# Patient Record
Sex: Female | Born: 1985 | Race: Black or African American | Hispanic: No | Marital: Single | Smoking: Former smoker
Health system: Southern US, Community
[De-identification: ages and names within clinical notes are randomized; demographics above are authoritative.]

## PROBLEM LIST (undated history)

## (undated) ENCOUNTER — Inpatient Hospital Stay (HOSPITAL_COMMUNITY): Payer: Self-pay

## (undated) DIAGNOSIS — R87629 Unspecified abnormal cytological findings in specimens from vagina: Secondary | ICD-10-CM

## (undated) DIAGNOSIS — F419 Anxiety disorder, unspecified: Secondary | ICD-10-CM

## (undated) DIAGNOSIS — O280 Abnormal hematological finding on antenatal screening of mother: Secondary | ICD-10-CM

## (undated) DIAGNOSIS — K219 Gastro-esophageal reflux disease without esophagitis: Secondary | ICD-10-CM

## (undated) HISTORY — PX: NO PAST SURGERIES: SHX2092

---
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
## 2008-01-13 ENCOUNTER — Emergency Department (HOSPITAL_COMMUNITY): Admission: EM | Admit: 2008-01-13 | Discharge: 2008-01-13 | Payer: Self-pay | Admitting: Emergency Medicine

## 2008-11-15 ENCOUNTER — Emergency Department (HOSPITAL_COMMUNITY): Admission: EM | Admit: 2008-11-15 | Discharge: 2008-11-16 | Payer: Self-pay | Admitting: Emergency Medicine

## 2010-12-07 LAB — CBC
HCT: 40.6 % (ref 36.0–46.0)
Hemoglobin: 13.9 g/dL (ref 12.0–15.0)
MCHC: 34.3 g/dL (ref 30.0–36.0)
RDW: 13.7 % (ref 11.5–15.5)

## 2010-12-07 LAB — BASIC METABOLIC PANEL
CO2: 23 mEq/L (ref 19–32)
GFR calc non Af Amer: >60 mL/min (ref >60)
Glucose, Bld: 92 mg/dL (ref 70–99)
Potassium: 3.5 mEq/L (ref 3.5–5.1)
Sodium: 140 mEq/L (ref 135–145)

## 2010-12-07 LAB — URINE MICROSCOPIC-ADD ON

## 2010-12-07 LAB — DIFFERENTIAL
Basophils Absolute: 0 10*3/uL (ref 0.0–0.1)
Eosinophils Relative: 0 % (ref 0–5)
Lymphocytes Relative: 51 % — ABNORMAL HIGH (ref 12–46)
Monocytes Absolute: 0.2 10*3/uL (ref 0.1–1.0)

## 2010-12-07 LAB — URINALYSIS, ROUTINE W REFLEX MICROSCOPIC
Bilirubin Urine: NEGATIVE
Hgb urine dipstick: NEGATIVE
Specific Gravity, Urine: 1.007 (ref 1.005–1.030)
pH: 7.5 (ref 5.0–8.0)

## 2010-12-07 LAB — RAPID URINE DRUG SCREEN, HOSP PERFORMED
Cocaine: NOT DETECTED
Opiates: NOT DETECTED

## 2011-05-23 LAB — POCT URINALYSIS DIP (DEVICE)
Bilirubin Urine: NEGATIVE
Ketones, ur: NEGATIVE
Operator id: 282151
Protein, ur: NEGATIVE
Specific Gravity, Urine: 1.02

## 2011-05-23 LAB — WET PREP, GENITAL
Clue Cells Wet Prep HPF POC: NONE SEEN
Trich, Wet Prep: NONE SEEN
Yeast Wet Prep HPF POC: NONE SEEN

## 2011-05-23 LAB — URINE CULTURE: Culture: NO GROWTH

## 2013-02-08 ENCOUNTER — Emergency Department (HOSPITAL_COMMUNITY)
Admission: EM | Admit: 2013-02-08 | Discharge: 2013-02-08 | Disposition: A | Discharge status: Home / Self Care | Payer: Medicaid Other | Attending: Emergency Medicine | Admitting: Emergency Medicine

## 2013-02-08 ENCOUNTER — Emergency Department (HOSPITAL_COMMUNITY): Payer: Medicaid Other

## 2013-02-08 ENCOUNTER — Encounter (HOSPITAL_COMMUNITY): Payer: Self-pay | Admitting: *Deleted

## 2013-02-08 DIAGNOSIS — W2209XA Striking against other stationary object, initial encounter: Secondary | ICD-10-CM | POA: Insufficient documentation

## 2013-02-08 DIAGNOSIS — S6982XA Other specified injuries of left wrist, hand and finger(s), initial encounter: Secondary | ICD-10-CM

## 2013-02-08 DIAGNOSIS — S8990XA Unspecified injury of unspecified lower leg, initial encounter: Secondary | ICD-10-CM | POA: Insufficient documentation

## 2013-02-08 DIAGNOSIS — S91109A Unspecified open wound of unspecified toe(s) without damage to nail, initial encounter: Secondary | ICD-10-CM | POA: Insufficient documentation

## 2013-02-08 DIAGNOSIS — Y9389 Activity, other specified: Secondary | ICD-10-CM | POA: Insufficient documentation

## 2013-02-08 DIAGNOSIS — S99922A Unspecified injury of left foot, initial encounter: Secondary | ICD-10-CM

## 2013-02-08 DIAGNOSIS — Y9289 Other specified places as the place of occurrence of the external cause: Secondary | ICD-10-CM | POA: Insufficient documentation

## 2013-02-08 MED ORDER — HYDROCODONE-ACETAMINOPHEN 5-325 MG PO TABS: ORAL_TABLET | ORAL | Status: DC

## 2013-02-08 MED ORDER — HYDROCODONE-ACETAMINOPHEN 5-325 MG PO TABS
1.0000 | ORAL_TABLET | Freq: Once | ORAL | Status: AC
  Administered 2013-02-08: 1 via ORAL
  Filled 2013-02-08: qty 1

## 2013-02-08 NOTE — ED Provider Notes (Signed)
Medical screening examination/treatment/procedure(s) were performed by non-physician practitioner and as supervising physician I was immediately available for consultation/collaboration.  Eydie Wormley R. Sharyl Panchal, MD 02/08/13 2324 

## 2013-02-08 NOTE — ED Notes (Signed)
Pt reports scraped big left toe on the concrete 3 days ago, throbbing pain 9/10. Then someone scraped her toe again. Pt believes toe might be getting infected.

## 2013-02-08 NOTE — ED Provider Notes (Signed)
History    This chart was scribed for non-physician practitioner Sarah Crigler, PA-C, working with Sarah Foster. Sarah Payor, MD by Sarah Foster, ED Scribe. This patient was seen in room WTR7/WTR7 and the patient's care was started at 4:48PM.   CSN: [id number]  Arrival date & time 02/08/13  1545   First MD Initiated Contact with Patient 02/08/13 1627      Chief Complaint  Patient presents with  . Toe Pain    (Consider location/radiation/quality/duration/timing/severity/associated sxs/prior treatment) The history is provided by the patient. No language interpreter was used.   HPI Comments: Sarah Foster is a 27 y.o. female who presents to the Emergency Department complaining of constant, moderate to severe left big toe pain with an onset 3 days ago. She reports that she stubbed her toe on cement and cut her toe. Also, she reports that someone ran into her toe yesterday. She has tried Tylenol, BC powder, peroxide, and neosporin which has not alleviated the symptoms as of yet. She is concerned that it is infected. No known allergies. No other pertinent medical symptoms.  History reviewed. No pertinent past medical history.  No past surgical history on file.  No family history on file.  History  Substance Use Topics  . Smoking status: Not on file  . Smokeless tobacco: Not on file  . Alcohol Use: Not on file    OB History   Grav Para Term Preterm Abortions TAB SAB Ect Mult Living                  Review of Systems  Constitutional: Negative for appetite change and fatigue.  HENT: Negative for congestion, sinus pressure and ear discharge.   Eyes: Negative for discharge.  Respiratory: Negative for cough.   Cardiovascular: Negative for chest pain.  Gastrointestinal: Negative for abdominal pain and diarrhea.  Genitourinary: Negative for frequency and hematuria.  Musculoskeletal: Positive for arthralgias. Negative for back pain.  Skin: Positive for wound. Negative for rash.   Neurological: Negative for seizures and headaches.  Psychiatric/Behavioral: Negative for hallucinations.  All other systems reviewed and are negative.    Allergies  Review of patient's allergies indicates no known allergies.  Home Medications   Current Outpatient Rx  Name  Route  Sig  Dispense  Refill  . Multiple Vitamin (MULTIVITAMIN WITH MINERALS) TABS   Oral   Take 1 tablet by mouth daily.           BP 95/71  Pulse 75  Temp(Src) 97.9 F (36.6 C) (Oral)  Resp 16  SpO2 100%  LMP 01/18/2013  Physical Exam  Nursing note and vitals reviewed. Constitutional: She appears well-developed.  HENT:  Head: Normocephalic.  Eyes: Conjunctivae are normal.  Neck: No tracheal deviation present.  Cardiovascular:  No murmur heard. Musculoskeletal: Normal range of motion.  Neurological: She is alert.  Skin: Skin is warm.  Linear, longitudinal 1 cm midline laceration to the left great toe around the nail bed. Missing medial 3rd of the toenail. Toenail laceration is abutted by the toenail itself.  Psychiatric: She has a normal mood and affect.    ED Course  Procedures (including critical care time)  COORDINATION OF CARE:  4:51PM - advised to stop using peroxide as it can slow down healing. Left great toe XR and vicodin will be ordered for Sarah Foster.   5:13PM - imaging results reviewed and are unremarkable.  5:33PM - will clean and treat wound and apply sterile dressing  5:41PM - Vicodin will be  Rx for her. She is ready for d/c.  Labs Reviewed - No data to display Dg Toe Great Left  02/08/2013   *RADIOLOGY REPORT*  Clinical Data: Pain, laceration  LEFT GREAT TOE  Comparison: None.  Findings: Three views of the left great toe submitted.  No acute fracture or subluxation.  No radiopaque foreign body.  IMPRESSION: No acute fracture or subluxation.   Original Report Authenticated By: Sarah Foster, M.D.     1. Toe injury, left, initial encounter   2. Nailbed injury, left,  initial encounter    Patient seen and examined. Foot was soaked in warm on her in peroxide. Wound was cleaned and explored. The toenail is missing.   Vital signs reviewed and are as follows: BP 95/71  Pulse 75  Temp(Src) 97.9 F (36.6 C) (Oral)  Resp 16  SpO2 100%  LMP 01/18/2013  Referrals for podiatry given. Friend at bedside states that she can ensure that the patient has transportation to podiatrist appointment.  The patient was urged to return to the Emergency Department urgently with worsening pain, swelling, expanding erythema especially if it streaks away from the affected area, fever, or if they have any other concerns. Patient verbalized understanding.   Patient counseled on use of narcotic pain medications. Counseled not to combine these medications with others containing tylenol. Urged not to drink alcohol, drive, or perform any other activities that requires focus while taking these medications. The patient verbalizes understanding and agrees with the plan.    MDM  Patient with nailbed laceration. Wound appears clean, hemostatic. Tetanus UTD. Given 3 days since the incident, this will not be closed in emergency department due to increased risk of infection. X-ray ordered to rule out possibility of open fracture. X-ray was negative. Patient will likely need toenail removed to allow for healing of wound. Podiatry referral given. Do not feel his procedure needs to be done emergently. Appropriate return instructions given.  I personally performed the services described in this documentation, which was scribed in my presence. The recorded information has been reviewed and is accurate.        Sarah Crigler, PA-C 02/08/13 1922

## 2013-02-08 NOTE — ED Notes (Signed)
She is pleased to have just seen our PA and thanks [place] for the pain med.  She is here with two female friends.  Her left great toe has some dark discoloration and fluctuance around nail margin. She has disruption of nail at medial nail margin which is allowing fluid/drainage to come out. As a result, her great toe has minimal erythema.

## 2013-02-11 ENCOUNTER — Emergency Department (HOSPITAL_COMMUNITY)
Admission: EM | Admit: 2013-02-11 | Discharge: 2013-02-11 | Disposition: A | Discharge status: Home / Self Care | Payer: Medicaid Other | Attending: Emergency Medicine | Admitting: Emergency Medicine

## 2013-02-11 ENCOUNTER — Encounter (HOSPITAL_COMMUNITY): Payer: Self-pay

## 2013-02-11 DIAGNOSIS — F17200 Nicotine dependence, unspecified, uncomplicated: Secondary | ICD-10-CM | POA: Insufficient documentation

## 2013-02-11 DIAGNOSIS — L600 Ingrowing nail: Secondary | ICD-10-CM | POA: Insufficient documentation

## 2013-02-11 MED ORDER — DOXYCYCLINE HYCLATE 100 MG PO CAPS: 100.0000 mg | ORAL_CAPSULE | Freq: Two times a day (BID) | ORAL | Status: DC

## 2013-02-11 MED ORDER — HYDROCODONE-ACETAMINOPHEN 5-325 MG PO TABS: 1.0000 | ORAL_TABLET | Freq: Four times a day (QID) | ORAL | Status: DC | PRN

## 2013-02-11 MED ORDER — HYDROCODONE-ACETAMINOPHEN 5-325 MG PO TABS
2.0000 | ORAL_TABLET | Freq: Once | ORAL | Status: AC
  Administered 2013-02-11: 2 via ORAL
  Filled 2013-02-11: qty 2

## 2013-02-11 NOTE — ED Notes (Signed)
Pt presents with left foot pain. Pt hit her foot on the cement and was seen here recently for same. Pain has not gotten any better.

## 2013-02-11 NOTE — ED Provider Notes (Signed)
History    This chart was scribed for non-physician practitioner, Arthor Captain, PA-C, working with Vida Roller, MD by Donne Anon, ED Scribe. This patient was seen in room WTR7/WTR7 and the patient's care was started at 2033.   CSN: [id number]  Arrival date & time 02/11/13  1850   First MD Initiated Contact with Patient 02/11/13 2033      Chief Complaint  Patient presents with  . Foot Pain     The history is provided by the patient. No language interpreter was used.   HPI Comments: Sarah Foster is a 27 y.o. female who presents to the Emergency Department complaining of sudden onset, gradually worsening, constant left foot pain that began a few days ago when she hit her foot on the cement. She rates her pain 10/10. She was seen here earlier this week for the same complaint and reports that the pain has not gotten any better with the hydrocodone she was discharged home with. She denies fever, chills, or any other pain.  History reviewed. No pertinent past medical history.  History reviewed. No pertinent past surgical history.  No family history on file.  History  Substance Use Topics  . Smoking status: Current Every Day Smoker  . Smokeless tobacco: Not on file  . Alcohol Use: Yes     Comment: occas     Review of Systems  Constitutional: Negative for fever and chills.  Musculoskeletal: Positive for arthralgias.  All other systems reviewed and are negative.    Allergies  Review of patient's allergies indicates no known allergies.  Home Medications   Current Outpatient Rx  Name  Route  Sig  Dispense  Refill  . HYDROcodone-acetaminophen (NORCO/VICODIN) 5-325 MG per tablet      Take 1-2 tablets every 6 hours as needed for severe pain   12 tablet   0   . Multiple Vitamin (MULTIVITAMIN WITH MINERALS) TABS   Oral   Take 1 tablet by mouth daily.           BP 101/66  Pulse 77  Temp(Src) 99.3 F (37.4 C) (Oral)  Resp 20  Ht 5\' 6"  (1.676 m)  Wt 134 lb  (60.782 kg)  BMI 21.64 kg/m2  SpO2 100%  LMP 01/18/2013  Physical Exam  Nursing note and vitals reviewed. Constitutional: She appears well-developed and well-nourished. No distress.  HENT:  Head: Normocephalic and atraumatic.  Eyes: Conjunctivae are normal.  Neck: Neck supple. No tracheal deviation present.  Cardiovascular: Normal rate.   Pulmonary/Chest: Effort normal. No respiratory distress.  Musculoskeletal: Normal range of motion.  Ingrown nail. Gross tissue swelling over the nail. Puss. Purulence. No drainable abscess or fluctuance. 2 cm surrounding cellulitis with no lymphangitis. Able to move toe.  Neurological: She is alert.  Skin: Skin is warm and dry.  Psychiatric: She has a normal mood and affect. Her behavior is normal.    ED Course  Procedures (including critical care time) DIAGNOSTIC STUDIES: Oxygen Saturation is 100% on RA, normal by my interpretation.    COORDINATION OF CARE: 9:25 PM Discussed treatment plan which includes an antibiotic with pt at bedside and pt agreed to plan. Advised pt to soak her foot in Epson salt and not wear closed toe shoes. Will give referral to podiatry.    Labs Reviewed - No data to display No results found.   1. Ingrowing toenail with infection       MDM  Patient with infected ingrown nail. She has severe pain. I  will d/c with doxy, pain meds and encourage soaks. Patient has follow up appointment with podiatry on 02/16/2013   I personally performed the services described in this documentation, which was scribed in my presence. The recorded information has been reviewed and is accurate.        Arthor Captain, PA-C 02/12/13 1010

## 2013-02-11 NOTE — ED Notes (Signed)
Pt states she has a ride home. 

## 2013-02-11 NOTE — ED Notes (Signed)
Pt ambulatory to exam room with steady gait.  

## 2013-02-12 NOTE — ED Provider Notes (Signed)
Medical screening examination/treatment/procedure(s) were performed by non-physician practitioner and as supervising physician I was immediately available for consultation/collaboration.    Vida Roller, MD 02/12/13 [phone]

## 2013-08-23 ENCOUNTER — Encounter (HOSPITAL_COMMUNITY): Payer: Self-pay | Admitting: Emergency Medicine

## 2013-08-23 ENCOUNTER — Emergency Department (HOSPITAL_COMMUNITY)
Admission: EM | Admit: 2013-08-23 | Discharge: 2013-08-23 | Disposition: A | Discharge status: Home / Self Care | Payer: Medicaid Other | Attending: Emergency Medicine | Admitting: Emergency Medicine

## 2013-08-23 DIAGNOSIS — F17200 Nicotine dependence, unspecified, uncomplicated: Secondary | ICD-10-CM | POA: Insufficient documentation

## 2013-08-23 DIAGNOSIS — J111 Influenza due to unidentified influenza virus with other respiratory manifestations: Secondary | ICD-10-CM

## 2013-08-23 DIAGNOSIS — R197 Diarrhea, unspecified: Secondary | ICD-10-CM | POA: Insufficient documentation

## 2013-08-23 DIAGNOSIS — R51 Headache: Secondary | ICD-10-CM | POA: Insufficient documentation

## 2013-08-23 DIAGNOSIS — R509 Fever, unspecified: Secondary | ICD-10-CM | POA: Insufficient documentation

## 2013-08-23 DIAGNOSIS — Z3202 Encounter for pregnancy test, result negative: Secondary | ICD-10-CM | POA: Insufficient documentation

## 2013-08-23 DIAGNOSIS — B9789 Other viral agents as the cause of diseases classified elsewhere: Secondary | ICD-10-CM | POA: Insufficient documentation

## 2013-08-23 DIAGNOSIS — R5381 Other malaise: Secondary | ICD-10-CM | POA: Insufficient documentation

## 2013-08-23 DIAGNOSIS — IMO0001 Reserved for inherently not codable concepts without codable children: Secondary | ICD-10-CM | POA: Insufficient documentation

## 2013-08-23 LAB — URINALYSIS, ROUTINE W REFLEX MICROSCOPIC
Bilirubin Urine: NEGATIVE
Ketones, ur: 40 mg/dL — AB
Specific Gravity, Urine: 1.016 (ref 1.005–1.030)
Urobilinogen, UA: 1 mg/dL (ref 0.0–1.0)
pH: 7.5 (ref 5.0–8.0)

## 2013-08-23 LAB — CBC WITH DIFFERENTIAL/PLATELET
Basophils Relative: 0 % (ref 0–1)
Eosinophils Absolute: 0 10*3/uL (ref 0.0–0.7)
Eosinophils Relative: 0 % (ref 0–5)
HCT: 35.1 % — ABNORMAL LOW (ref 36.0–46.0)
Hemoglobin: 12 g/dL (ref 12.0–15.0)
Lymphs Abs: 0.5 10*3/uL — ABNORMAL LOW (ref 0.7–4.0)
MCH: 31.2 pg (ref 26.0–34.0)
MCHC: 34.2 g/dL (ref 30.0–36.0)
MCV: 91.2 fL (ref 78.0–100.0)
Monocytes Absolute: 0.4 10*3/uL (ref 0.1–1.0)
Monocytes Relative: 10 % (ref 3–12)

## 2013-08-23 LAB — POCT I-STAT, CHEM 8
Calcium, Ion: 1.22 mmol/L (ref 1.12–1.23)
Chloride: 103 mEq/L (ref 96–112)
Creatinine, Ser: 0.8 mg/dL (ref 0.50–1.10)
Glucose, Bld: 88 mg/dL (ref 70–99)
Potassium: 3.6 mEq/L (ref 3.5–5.1)

## 2013-08-23 LAB — URINE MICROSCOPIC-ADD ON

## 2013-08-23 MED ORDER — BENZONATATE 100 MG PO CAPS: 100.0000 mg | ORAL_CAPSULE | Freq: Three times a day (TID) | ORAL | Status: DC

## 2013-08-23 MED ORDER — SODIUM CHLORIDE 0.9 % IV BOLUS (SEPSIS)
1000.0000 mL | Freq: Once | INTRAVENOUS | Status: AC
  Administered 2013-08-23: 1000 mL via INTRAVENOUS

## 2013-08-23 MED ORDER — ALBUTEROL SULFATE HFA 108 (90 BASE) MCG/ACT IN AERS
2.0000 | INHALATION_SPRAY | Freq: Once | RESPIRATORY_TRACT | Status: AC
  Administered 2013-08-23: 2 via RESPIRATORY_TRACT
  Filled 2013-08-23: qty 6.7

## 2013-08-23 MED ORDER — ALBUTEROL SULFATE HFA 108 (90 BASE) MCG/ACT IN AERS: 2.0000 | INHALATION_SPRAY | Freq: Once | RESPIRATORY_TRACT | Status: DC

## 2013-08-23 NOTE — ED Notes (Signed)
Patient d/c to home with significant other. MD notified of temperature.

## 2013-08-23 NOTE — ED Notes (Signed)
Respiratory called for breathing treatment.

## 2013-08-23 NOTE — ED Notes (Signed)
Per pt, flu like symptoms since yesterday, has been taking OTC meds with no relief

## 2013-08-23 NOTE — ED Provider Notes (Signed)
CSN: [id number]     Arrival date & time 08/23/13  1539 History   First MD Initiated Contact with Patient 08/23/13 1622     Chief Complaint  Patient presents with  . flu like symptoms    (Consider location/radiation/quality/duration/timing/severity/associated sxs/prior Treatment) The history is provided by the patient. No language interpreter was used.   Pt is a 27 year old female who presents with history of cough, fever and headache. She reports that this started 2 days ago and she is feeling worse today. She reports that she has had a few episodes of diarrhea but no vomiting. She has had family that has been sick with similar symptoms. She denies any asthma, difficulty breathing or shortness of breath. She reports that her cough worsens at night and she is having generalized body aches and fatigue. No recent travel.   History reviewed. No pertinent past medical history. History reviewed. No pertinent past surgical history. No family history on file. History  Substance Use Topics  . Smoking status: Current Every Day Smoker  . Smokeless tobacco: Not on file  . Alcohol Use: Yes     Comment: occas   OB History   Grav Para Term Preterm Abortions TAB SAB Ect Mult Living                 Review of Systems  Constitutional: Positive for fever, chills and fatigue.  Respiratory: Negative for chest tightness and shortness of breath.   Cardiovascular: Negative for chest pain.  Gastrointestinal: Positive for nausea and diarrhea. Negative for vomiting and abdominal pain.  Genitourinary: Negative for dysuria.  Neurological: Positive for weakness.  All other systems reviewed and are negative.    Allergies  Review of patient's allergies indicates no known allergies.  Home Medications   Current Outpatient Rx  Name  Route  Sig  Dispense  Refill  . ibuprofen (ADVIL,MOTRIN) 200 MG tablet   Oral   Take 400 mg by mouth every 6 (six) hours as needed for fever or headache.          BP  101/67  Pulse 99  Temp(Src) 99 F (37.2 C) (Oral)  Resp 20  SpO2 100%  LMP 07/24/2013 Physical Exam  Nursing note and vitals reviewed. Constitutional: She is oriented to person, place, and time. She appears well-developed and well-nourished. No distress.  HENT:  Head: Normocephalic and atraumatic.  Mouth/Throat: Oropharynx is clear and moist.  Eyes: Conjunctivae and EOM are normal.  Neck: Normal range of motion. Neck supple. No JVD present. No tracheal deviation present. No thyromegaly present.  Cardiovascular: Normal rate, regular rhythm, normal heart sounds and intact distal pulses.   Pulmonary/Chest: Effort normal and breath sounds normal. No respiratory distress. She has no wheezes.  Abdominal: Soft. Bowel sounds are normal. She exhibits no distension. There is no tenderness.  Musculoskeletal: Normal range of motion.  Neurological: She is alert and oriented to person, place, and time.  Skin: Skin is warm and dry.  Psychiatric: She has a normal mood and affect. Her behavior is normal. Judgment and thought content normal.    ED Course  Procedures (including critical care time) Labs Review Labs Reviewed - No data to display Imaging Review No results found.  EKG Interpretation   None       MDM   1. Influenza      CBC, Chem 8, UA all unremarkable. Symptoms consistent with influenza. Diarrhea, no vomiting. Pt able to tolerate PO liquids here. Discussed plan of care with pt  and agrees. Prescription for tessalon and albuterol MDI given for cough. Return precautions given.    Irish Elders, NP 08/24/13 [phone]

## 2013-08-24 NOTE — ED Provider Notes (Signed)
Medical screening examination/treatment/procedure(s) were performed by non-physician practitioner and as supervising physician I was immediately available for consultation/collaboration.  Sharod Petsch M Maegen Wigle, MD 08/24/13 1622 

## 2013-11-04 ENCOUNTER — Encounter (HOSPITAL_COMMUNITY): Payer: Self-pay | Admitting: Emergency Medicine

## 2013-11-04 ENCOUNTER — Emergency Department (HOSPITAL_COMMUNITY)
Admission: EM | Admit: 2013-11-04 | Discharge: 2013-11-04 | Disposition: A | Discharge status: Home / Self Care | Payer: Medicaid Other | Attending: Emergency Medicine | Admitting: Emergency Medicine

## 2013-11-04 DIAGNOSIS — F17200 Nicotine dependence, unspecified, uncomplicated: Secondary | ICD-10-CM | POA: Insufficient documentation

## 2013-11-04 DIAGNOSIS — Z3202 Encounter for pregnancy test, result negative: Secondary | ICD-10-CM | POA: Insufficient documentation

## 2013-11-04 DIAGNOSIS — K2970 Gastritis, unspecified, without bleeding: Secondary | ICD-10-CM | POA: Insufficient documentation

## 2013-11-04 DIAGNOSIS — K2990 Gastroduodenitis, unspecified, without bleeding: Principal | ICD-10-CM

## 2013-11-04 LAB — POC URINE PREG, ED: Preg Test, Ur: NEGATIVE

## 2013-11-04 LAB — CBC WITH DIFFERENTIAL/PLATELET
BASOS ABS: 0 10*3/uL (ref 0.0–0.1)
BASOS PCT: 0 % (ref 0–1)
EOS ABS: 0 10*3/uL (ref 0.0–0.7)
Eosinophils Relative: 1 % (ref 0–5)
HEMATOCRIT: 41.5 % (ref 36.0–46.0)
Hemoglobin: 14.2 g/dL (ref 12.0–15.0)
Lymphocytes Relative: 33 % (ref 12–46)
Lymphs Abs: 1.8 10*3/uL (ref 0.7–4.0)
MCH: 31.6 pg (ref 26.0–34.0)
MCHC: 34.2 g/dL (ref 30.0–36.0)
MCV: 92.2 fL (ref 78.0–100.0)
Monocytes Absolute: 0.4 10*3/uL (ref 0.1–1.0)
Monocytes Relative: 7 % (ref 3–12)
Neutro Abs: 3.2 10*3/uL (ref 1.7–7.7)
Neutrophils Relative %: 59 % (ref 43–77)
PLATELETS: 327 10*3/uL (ref 150–400)
RBC: 4.5 MIL/uL (ref 3.87–5.11)
RDW: 14 % (ref 11.5–15.5)
WBC: 5.4 10*3/uL (ref 4.0–10.5)

## 2013-11-04 LAB — COMPREHENSIVE METABOLIC PANEL
ALT: 11 U/L (ref 0–35)
AST: 14 U/L (ref 0–37)
Albumin: 4 g/dL (ref 3.5–5.2)
Alkaline Phosphatase: 57 U/L (ref 39–117)
BUN: 10 mg/dL (ref 6–23)
CALCIUM: 9.1 mg/dL (ref 8.4–10.5)
CO2: 25 mEq/L (ref 19–32)
Chloride: 100 mEq/L (ref 96–112)
Creatinine, Ser: 0.76 mg/dL (ref 0.50–1.10)
GFR calc non Af Amer: >90 mL/min (ref >90)
Glucose, Bld: 124 mg/dL — ABNORMAL HIGH (ref 70–99)
Potassium: 3.9 mEq/L (ref 3.7–5.3)
SODIUM: 137 meq/L (ref 137–147)
TOTAL PROTEIN: 7.4 g/dL (ref 6.0–8.3)
Total Bilirubin: 0.5 mg/dL (ref 0.3–1.2)

## 2013-11-04 LAB — URINALYSIS, ROUTINE W REFLEX MICROSCOPIC
Bilirubin Urine: NEGATIVE
GLUCOSE, UA: NEGATIVE mg/dL
Hgb urine dipstick: NEGATIVE
Ketones, ur: NEGATIVE mg/dL
Leukocytes, UA: NEGATIVE
NITRITE: NEGATIVE
Protein, ur: NEGATIVE mg/dL
Specific Gravity, Urine: 1.029 (ref 1.005–1.030)
Urobilinogen, UA: 2 mg/dL — ABNORMAL HIGH (ref 0.0–1.0)
pH: 8 (ref 5.0–8.0)

## 2013-11-04 LAB — LIPASE, BLOOD: Lipase: 19 U/L (ref 11–59)

## 2013-11-04 MED ORDER — ONDANSETRON HCL 4 MG/2ML IJ SOLN
4.0000 mg | Freq: Once | INTRAMUSCULAR | Status: AC
  Administered 2013-11-04: 4 mg via INTRAVENOUS
  Filled 2013-11-04: qty 2

## 2013-11-04 MED ORDER — FENTANYL CITRATE 0.05 MG/ML IJ SOLN
100.0000 ug | Freq: Once | INTRAMUSCULAR | Status: AC
  Administered 2013-11-04: 100 ug via INTRAVENOUS
  Filled 2013-11-04: qty 2

## 2013-11-04 MED ORDER — HYDROCODONE-ACETAMINOPHEN 5-325 MG PO TABS: 1.0000 | ORAL_TABLET | Freq: Four times a day (QID) | ORAL | Status: DC | PRN

## 2013-11-04 MED ORDER — MORPHINE SULFATE 4 MG/ML IJ SOLN
4.0000 mg | Freq: Once | INTRAMUSCULAR | Status: DC
  Filled 2013-11-04: qty 1

## 2013-11-04 MED ORDER — FAMOTIDINE 20 MG PO TABS: 20.0000 mg | ORAL_TABLET | Freq: Two times a day (BID) | ORAL | Status: DC

## 2013-11-04 MED ORDER — GI COCKTAIL ~~LOC~~
30.0000 mL | Freq: Once | ORAL | Status: AC
  Administered 2013-11-04: 30 mL via ORAL
  Filled 2013-11-04: qty 30

## 2013-11-04 MED ORDER — SUCRALFATE 1 G PO TABS: 1.0000 g | ORAL_TABLET | Freq: Three times a day (TID) | ORAL | Status: DC

## 2013-11-04 MED ORDER — SODIUM CHLORIDE 0.9 % IV BOLUS (SEPSIS)
1000.0000 mL | Freq: Once | INTRAVENOUS | Status: AC
  Administered 2013-11-04: 1000 mL via INTRAVENOUS

## 2013-11-04 MED ORDER — MORPHINE SULFATE 4 MG/ML IJ SOLN
4.0000 mg | Freq: Once | INTRAMUSCULAR | Status: AC
  Administered 2013-11-04: 4 mg via INTRAVENOUS

## 2013-11-04 MED ORDER — PROMETHAZINE HCL 25 MG PO TABS: 25.0000 mg | ORAL_TABLET | Freq: Three times a day (TID) | ORAL | Status: DC | PRN

## 2013-11-04 NOTE — ED Provider Notes (Signed)
CSN: [id number]     Arrival date & time 11/04/13  1034 History   First MD Initiated Contact with Patient 11/04/13 1117     Chief Complaint  Patient presents with  . Abdominal Pain     (Consider location/radiation/quality/duration/timing/severity/associated sxs/prior Treatment) HPI: [name] presents today with chief complaint of abdominal pain.  She reports onset of "heartburn" yesterday after eating "a large amount" of banana pudding.  She denies history of GERD.  Heartburn gradually resolved and has been replaced by diffuse, worsening "bad hunger pain" averaging 7-8/10 in severity over the last 24 hours.  States the pain was a 10/10 when she came in today and is worse while she is passing a BM and when she is vomiting.  Currently rates pain 5/10.  She has had 4 episodes of loose stools and one episode of emesis in the last 24 hours.  She tried  aspirin to alleviate the pain but vomited shortly after taking it.  She denies presence of blood in emesis and stool.  She has not checked her temperature at home but has felt subjectively hot and has had chills.  She reports that she has had contact with a child with similar symptoms.  Her LMP ended this past Thursday.  History reviewed. No pertinent past medical history. History reviewed. No pertinent past surgical history. No family history on file. History  Substance Use Topics  . Smoking status: Current Every Day Smoker -- 0.50 packs/day    Types: Cigarettes  . Smokeless tobacco: Not on file  . Alcohol Use: Yes     Comment: occas   OB History   Grav Para Term Preterm Abortions TAB SAB Ect Mult Living                 Review of Systems  All other systems negative except as documented in the HPI. All pertinent positives and negatives as reviewed in the HPI.   Allergies  Review of patient's allergies indicates no known allergies.  Home Medications   Current Outpatient Rx  Name  Route  Sig  Dispense  Refill  . aspirin 81 MG chewable  tablet   Oral   Chew 81 mg by mouth daily as needed for moderate pain.         Marland Kitchen. ibuprofen (ADVIL,MOTRIN) 200 MG tablet   Oral   Take 400 mg by mouth every 6 (six) hours as needed for fever or headache.          BP 107/70  Pulse 82  Temp(Src) 98.9 F (37.2 C) (Oral)  Resp 16  Ht 5\' 6"  (1.676 m)  Wt 137 lb 9.6 oz (62.415 kg)  BMI 22.22 kg/m2  SpO2 100%  LMP 10/29/2013 Physical Exam  Nursing note and vitals reviewed. Constitutional: She is oriented to person, place, and time. She appears well-developed and well-nourished. No distress.  HENT:  Head: Normocephalic and atraumatic.  Mouth/Throat: Oropharynx is clear and moist.  Eyes: Pupils are equal, round, and reactive to light.  Neck: Normal range of motion. Neck supple.  Cardiovascular: Normal rate, regular rhythm and normal heart sounds.   Pulmonary/Chest: Effort normal and breath sounds normal. No respiratory distress.  Abdominal: Soft. Bowel sounds are normal. She exhibits no distension and no mass. There is no tenderness. There is no rebound and no guarding.  Bowel sounds hyperactive in all quadrants.  Abdomen none-tender to light and deep palpation in all quadrants.  Neurological: She is alert and oriented to person, place, and time. She  exhibits normal muscle tone. Coordination normal.  Skin: Skin is warm and dry.  Psychiatric:  Patient smiling and laughing during interview and exam.    ED Course  Procedures (including critical care time) Labs Review Labs Reviewed  COMPREHENSIVE METABOLIC PANEL - Abnormal; Notable for the following:    Glucose, Bld 124 (*)    All other components within normal limits  URINALYSIS, ROUTINE W REFLEX MICROSCOPIC - Abnormal; Notable for the following:    Urobilinogen, UA 2.0 (*)    All other components within normal limits  CBC WITH DIFFERENTIAL  LIPASE, BLOOD  POC URINE PREG, ED      The patient is stable and told to return here as needed. Given GI follow up. Told to return  here as needed.  Carlyle Dolly, PA-C 11/14/13 [phone]

## 2013-11-04 NOTE — ED Notes (Signed)
Pt alert x4 ambulates without distress. 

## 2013-11-04 NOTE — ED Notes (Signed)
Pt c/o abd pain that started yesterday. sts also having some heart burn, drank some water and went back to sleep but no relief. Then yesterday having some diarrhea. sts 1 episode of diarrhea today. Still having mid abd pain today, reports it "feels like really bad hunger pain", denies loss of appetite. Denies fever at home. Denies urinary symptoms. Nad, skin warm and dry, resp e/u.

## 2013-11-04 NOTE — Discharge Instructions (Signed)
Return here as needed.  Followup with the, GI Dr. provided as needed.  Slowly increase her fluid intake, rest as much as possible

## 2013-11-18 NOTE — ED Provider Notes (Signed)
Medical screening examination/treatment/procedure(s) were performed by non-physician practitioner and as supervising physician I was immediately available for consultation/collaboration.   EKG Interpretation None        Phung Kotas H Rhone Ozaki, MD 11/18/13 0805 

## 2014-02-02 ENCOUNTER — Encounter (HOSPITAL_COMMUNITY): Payer: Self-pay

## 2014-02-02 ENCOUNTER — Inpatient Hospital Stay (HOSPITAL_COMMUNITY)
Admission: AD | Admit: 2014-02-02 | Discharge: 2014-02-02 | Disposition: A | Discharge status: Home / Self Care | Payer: Medicaid Other | Source: Ambulatory Visit | Attending: Obstetrics and Gynecology | Admitting: Obstetrics and Gynecology

## 2014-02-02 DIAGNOSIS — N898 Other specified noninflammatory disorders of vagina: Secondary | ICD-10-CM | POA: Insufficient documentation

## 2014-02-02 DIAGNOSIS — Z87891 Personal history of nicotine dependence: Secondary | ICD-10-CM | POA: Insufficient documentation

## 2014-02-02 DIAGNOSIS — N939 Abnormal uterine and vaginal bleeding, unspecified: Secondary | ICD-10-CM

## 2014-02-02 DIAGNOSIS — R109 Unspecified abdominal pain: Secondary | ICD-10-CM | POA: Insufficient documentation

## 2014-02-02 LAB — WET PREP, GENITAL
Clue Cells Wet Prep HPF POC: NONE SEEN
Trich, Wet Prep: NONE SEEN
Yeast Wet Prep HPF POC: NONE SEEN

## 2014-02-02 LAB — HCG, QUANTITATIVE, PREGNANCY: HCG, BETA CHAIN, QUANT, S: 1 m[IU]/mL (ref <5)

## 2014-02-02 LAB — URINALYSIS, ROUTINE W REFLEX MICROSCOPIC
Bilirubin Urine: NEGATIVE
GLUCOSE, UA: NEGATIVE mg/dL
Ketones, ur: NEGATIVE mg/dL
Leukocytes, UA: NEGATIVE
Nitrite: NEGATIVE
PROTEIN: NEGATIVE mg/dL
SPECIFIC GRAVITY, URINE: 1.015 (ref 1.005–1.030)
Urobilinogen, UA: 0.2 mg/dL (ref 0.0–1.0)
pH: 6.5 (ref 5.0–8.0)

## 2014-02-02 LAB — CBC
HCT: 37.9 % (ref 36.0–46.0)
HEMOGLOBIN: 12.7 g/dL (ref 12.0–15.0)
MCH: 31.8 pg (ref 26.0–34.0)
MCHC: 33.5 g/dL (ref 30.0–36.0)
MCV: 94.8 fL (ref 78.0–100.0)
Platelets: 263 10*3/uL (ref 150–400)
RBC: 4 MIL/uL (ref 3.87–5.11)
RDW: 13.3 % (ref 11.5–15.5)
WBC: 5.8 10*3/uL (ref 4.0–10.5)

## 2014-02-02 LAB — URINE MICROSCOPIC-ADD ON

## 2014-02-02 LAB — ABO/RH: ABO/RH(D): O POS

## 2014-02-02 LAB — POCT PREGNANCY, URINE: PREG TEST UR: NEGATIVE

## 2014-02-02 NOTE — MAU Note (Signed)
Positive pregnancy test at urgent care on 5/22. LMP 11/25/13; normally has period every month, no birth control. Implanon removed last year.  Lower abdominal "squeezing" pain, intermittently, states feels like gas. Vaginal bleeding since last night; started out brown, red this morning; on toilet paper & small amount on panty liners.

## 2014-02-02 NOTE — Discharge Instructions (Signed)
Primary Care Resources:    [facility]  [address] [place] [address] Ph [phone] Every 2nd Saturday 9am-12pm  [email or web address] [facility]  [address] [place] Ph [phone]  [address] [place] Ph [phone]  [email or web address] $45 per visit/Walk-in only    [facility]  [address] [place] [address] Ph [phone]  1st & 3rd Saturday of each month 9:30am-12:30pm [email or web address] Sliding fee scale/Call to make an appointment    [facility]  [address] [place] Ph [phone]  Hours Mon-Fri 9am-7pm & Sat 9am-1pm [email or web address] Visits start at $45 per visit/Call to make an appointment    [facility]  [address] [place] [address] Ph [phone]  Hours Mon-Wed 8:30am-5pm & Thurs 8:30am-8pm $5 per visit/Call for an eligibility appointment     Abnormal Uterine Bleeding Abnormal uterine bleeding means bleeding from the vagina that is not your normal menstrual period. This can be:  Bleeding or spotting between periods.  Bleeding after sex (sexual intercourse).  Bleeding that is heavier or more than normal.  Periods that last longer than usual.  Bleeding after menopause. There are many problems that may cause this. Treatment will depend on the cause of the bleeding. Any kind of bleeding that is not normal should be reviewed by your doctor.  HOME CARE Watch your condition for any changes. These actions may lessen any discomfort you are having:  Do not use tampons or douches as told by your doctor.  Change your pads often. You should get regular pelvic exams and Pap tests. Keep all appointments for tests as told by your doctor. GET HELP IF:  You are bleeding for more than 1 week.  You feel dizzy at times. GET HELP RIGHT AWAY IF:   You pass out.  You have to change  pads every 15 to 30 minutes.  You have belly pain.  You have a fever.  You become sweaty or weak.  You are passing large blood clots from the vagina.  You feel sick to your stomach (nauseous) and throw up (vomit). MAKE SURE YOU:  Understand these instructions.  Will watch your condition.  Will get help right away if you are not doing well or get worse. Document Released: 06/10/2009 Document Revised: 06/03/2013 Document Reviewed: 03/12/2013 [facility] Patient Information 2014 [place], [place].

## 2014-02-02 NOTE — MAU Provider Note (Signed)
History     CSN: [id number]  Arrival date and time: 02/02/14 4332   First Provider Initiated Contact with Patient 02/02/14 0759      Chief Complaint  Patient presents with  . Possible Pregnancy  . Vaginal Bleeding   Possible Pregnancy Associated symptoms include abdominal pain and nausea. Pertinent negatives include no chills, fever or vomiting.  Vaginal Bleeding Associated symptoms include abdominal pain and nausea. Pertinent negatives include no chills, constipation, diarrhea, fever or vomiting.    Pt is a 28 yo here with report of vaginal spotting of blood that started yesterday.  Also reports midpelvic cramping.  Patient's last menstrual period was 11/25/2013.  Pt reports spotting for 4 weeks.  Pregnancy test taken on 01/15/14 at [facility] (pt brought records).  Denies abnormal vaginal discharge or odor.  No report of clots.  Pt declines pain medication at this time.  Reports urinary frequency throughout lifetime.  Urinates 2-3x/hour.  Denies dysuria.     History reviewed. No pertinent past medical history.  History reviewed. No pertinent past surgical history.  History reviewed. No pertinent family history.  History  Substance Use Topics  . Smoking status: Former Smoker -- 0.50 packs/day    Types: Cigarettes  . Smokeless tobacco: Not on file  . Alcohol Use: Yes     Comment: occas    Allergies: No Known Allergies  Prescriptions prior to admission  Medication Sig Dispense Refill  . Prenatal Vit-Fe Fumarate-FA (PRENATAL MULTIVITAMIN) TABS tablet Take 1 tablet by mouth daily at 12 noon.        Review of Systems  Constitutional: Negative for fever and chills.  Gastrointestinal: Positive for nausea and abdominal pain. Negative for vomiting, diarrhea and constipation.       Flatulence  Genitourinary: Positive for vaginal bleeding.  All other systems reviewed and are negative.  Physical Exam   Blood pressure 115/79, pulse 75,  temperature 98.7 F (37.1 C), temperature source Oral, resp. rate 18, height 5\' 6"  (1.676 m), weight 65.318 kg (144 lb), last menstrual period 11/25/2013.  Physical Exam  Constitutional: She is oriented to person, place, and time. She appears well-developed and well-nourished. No distress.  HENT:  Head: Normocephalic.  Neck: Normal range of motion. Neck supple.  Cardiovascular: Normal rate, regular rhythm and normal heart sounds.   Respiratory: Effort normal and breath sounds normal.  GI: Soft. There is tenderness (mid pelvic).  Genitourinary: Uterus is tender. Uterus is not enlarged. Right adnexum displays no mass and no tenderness. Left adnexum displays no mass and no tenderness. There is bleeding (moderate, neg clots) around the vagina.  Musculoskeletal: Normal range of motion. She exhibits no edema.  Neurological: She is alert and oriented to person, place, and time.  Skin: Skin is warm and dry.    MAU Course  Procedures  Results for orders placed during the hospital encounter of 02/02/14 (from the past 24 hour(s))  URINALYSIS, ROUTINE W REFLEX MICROSCOPIC     Status: Abnormal   Collection Time    02/02/14  6:51 AM      Result Value Ref Range   Color, Urine YELLOW  YELLOW   APPearance HAZY (*) CLEAR   Specific Gravity, Urine 1.015  1.005 - 1.030   pH 6.5  5.0 - 8.0   Glucose, UA NEGATIVE  NEGATIVE mg/dL   Hgb urine dipstick LARGE (*) NEGATIVE   Bilirubin Urine NEGATIVE  NEGATIVE   Ketones, ur NEGATIVE  NEGATIVE mg/dL   Protein, ur NEGATIVE  NEGATIVE mg/dL   Urobilinogen, UA 0.2  0.0 - 1.0 mg/dL   Nitrite NEGATIVE  NEGATIVE   Leukocytes, UA NEGATIVE  NEGATIVE  URINE MICROSCOPIC-ADD ON     Status: Abnormal   Collection Time    02/02/14  6:51 AM      Result Value Ref Range   Squamous Epithelial / LPF FEW (*) RARE   RBC / HPF 3-6  <3 RBC/hpf   Bacteria, UA FEW (*) RARE  POCT PREGNANCY, URINE     Status: None   Collection Time    02/02/14  7:03 AM      Result Value Ref  Range   Preg Test, Ur NEGATIVE  NEGATIVE  HCG, QUANTITATIVE, PREGNANCY     Status: None   Collection Time    02/02/14  7:43 AM      Result Value Ref Range   hCG, Beta Chain, Quant, S 1  <5 mIU/mL  ABO/RH     Status: None   Collection Time    02/02/14  7:43 AM      Result Value Ref Range   ABO/RH(D) O POS    CBC     Status: None   Collection Time    02/02/14  7:43 AM      Result Value Ref Range   WBC 5.8  4.0 - 10.5 K/uL   RBC 4.00  3.87 - 5.11 MIL/uL   Hemoglobin 12.7  12.0 - 15.0 g/dL   HCT 16.137.9  09.636.0 - 04.546.0 %   MCV 94.8  78.0 - 100.0 fL   MCH 31.8  26.0 - 34.0 pg   MCHC 33.5  30.0 - 36.0 g/dL   RDW 40.913.3  81.111.5 - 91.415.5 %   Platelets 263  150 - 400 K/uL  WET PREP, GENITAL     Status: Abnormal   Collection Time    02/02/14  8:25 AM      Result Value Ref Range   Yeast Wet Prep HPF POC NONE SEEN  NONE SEEN   Trich, Wet Prep NONE SEEN  NONE SEEN   Clue Cells Wet Prep HPF POC NONE SEEN  NONE SEEN   WBC, Wet Prep HPF POC FEW (*) NONE SEEN    Assessment and Plan  Vaginal Bleeding - Likely Menses  Plan: Discharge to home Urine culture Follow-up with family medicine provider for lifelong issues with urinary frequency.  Eino FarberWalidah N Muhammad 02/02/2014, 8:03 AM

## 2014-02-03 LAB — GC/CHLAMYDIA PROBE AMP
CT Probe RNA: NEGATIVE
GC Probe RNA: NEGATIVE

## 2014-02-03 LAB — URINE CULTURE
COLONY COUNT: NO GROWTH
Culture: NO GROWTH

## 2014-02-03 NOTE — MAU Provider Note (Signed)
Attestation of Attending Supervision of Advanced Practitioner (CNM/NP): Evaluation and management procedures were performed by the Advanced Practitioner under my supervision and collaboration.  I have reviewed the Advanced Practitioner's note and chart, and I agree with the management and plan.  Nalin Mazzocco 02/03/2014 3:57 PM   

## 2015-03-12 ENCOUNTER — Encounter (HOSPITAL_COMMUNITY): Payer: Self-pay | Admitting: *Deleted

## 2015-03-12 ENCOUNTER — Emergency Department (HOSPITAL_COMMUNITY): Payer: No Typology Code available for payment source

## 2015-03-12 ENCOUNTER — Emergency Department (HOSPITAL_COMMUNITY)
Admission: EM | Admit: 2015-03-12 | Discharge: 2015-03-12 | Disposition: A | Discharge status: Home / Self Care | Payer: No Typology Code available for payment source | Attending: Emergency Medicine | Admitting: Emergency Medicine

## 2015-03-12 DIAGNOSIS — S4991XA Unspecified injury of right shoulder and upper arm, initial encounter: Secondary | ICD-10-CM | POA: Diagnosis present

## 2015-03-12 DIAGNOSIS — Y9389 Activity, other specified: Secondary | ICD-10-CM | POA: Insufficient documentation

## 2015-03-12 DIAGNOSIS — Z87891 Personal history of nicotine dependence: Secondary | ICD-10-CM | POA: Insufficient documentation

## 2015-03-12 DIAGNOSIS — Z3202 Encounter for pregnancy test, result negative: Secondary | ICD-10-CM | POA: Insufficient documentation

## 2015-03-12 DIAGNOSIS — S46911A Strain of unspecified muscle, fascia and tendon at shoulder and upper arm level, right arm, initial encounter: Secondary | ICD-10-CM | POA: Diagnosis not present

## 2015-03-12 DIAGNOSIS — S39012A Strain of muscle, fascia and tendon of lower back, initial encounter: Secondary | ICD-10-CM

## 2015-03-12 DIAGNOSIS — Y92410 Unspecified street and highway as the place of occurrence of the external cause: Secondary | ICD-10-CM | POA: Diagnosis not present

## 2015-03-12 DIAGNOSIS — Z79899 Other long term (current) drug therapy: Secondary | ICD-10-CM | POA: Diagnosis not present

## 2015-03-12 DIAGNOSIS — Y998 Other external cause status: Secondary | ICD-10-CM | POA: Insufficient documentation

## 2015-03-12 LAB — PREGNANCY, URINE: PREG TEST UR: NEGATIVE

## 2015-03-12 MED ORDER — CYCLOBENZAPRINE HCL 10 MG PO TABS: 10.0000 mg | ORAL_TABLET | Freq: Two times a day (BID) | ORAL | Status: DC | PRN

## 2015-03-12 MED ORDER — IBUPROFEN 400 MG PO TABS
800.0000 mg | ORAL_TABLET | Freq: Once | ORAL | Status: AC
  Administered 2015-03-12: 800 mg via ORAL
  Filled 2015-03-12: qty 2

## 2015-03-12 MED ORDER — IBUPROFEN 800 MG PO TABS: 800.0000 mg | ORAL_TABLET | Freq: Three times a day (TID) | ORAL | Status: DC

## 2015-03-12 NOTE — ED Notes (Signed)
Declined W/C at D/C and was escorted to lobby by RN. 
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
## 2015-08-28 DIAGNOSIS — R87629 Unspecified abnormal cytological findings in specimens from vagina: Secondary | ICD-10-CM

## 2015-08-28 HISTORY — DX: Unspecified abnormal cytological findings in specimens from vagina: R87.629

## 2016-01-03 ENCOUNTER — Emergency Department (HOSPITAL_COMMUNITY): Payer: Medicaid Other

## 2016-01-03 ENCOUNTER — Emergency Department (HOSPITAL_COMMUNITY)
Admission: EM | Admit: 2016-01-03 | Discharge: 2016-01-03 | Disposition: A | Discharge status: Home / Self Care | Payer: Medicaid Other | Attending: Emergency Medicine | Admitting: Emergency Medicine

## 2016-01-03 ENCOUNTER — Encounter (HOSPITAL_COMMUNITY): Payer: Self-pay | Admitting: Emergency Medicine

## 2016-01-03 DIAGNOSIS — N939 Abnormal uterine and vaginal bleeding, unspecified: Secondary | ICD-10-CM

## 2016-01-03 DIAGNOSIS — O209 Hemorrhage in early pregnancy, unspecified: Secondary | ICD-10-CM | POA: Diagnosis present

## 2016-01-03 DIAGNOSIS — Z87891 Personal history of nicotine dependence: Secondary | ICD-10-CM | POA: Insufficient documentation

## 2016-01-03 DIAGNOSIS — Z79899 Other long term (current) drug therapy: Secondary | ICD-10-CM | POA: Diagnosis not present

## 2016-01-03 DIAGNOSIS — Z3A01 Less than 8 weeks gestation of pregnancy: Secondary | ICD-10-CM | POA: Insufficient documentation

## 2016-01-03 LAB — BASIC METABOLIC PANEL
Anion gap: 10 (ref 5–15)
BUN: 13 mg/dL (ref 6–20)
CO2: 22 mmol/L (ref 22–32)
Calcium: 9.3 mg/dL (ref 8.9–10.3)
Chloride: 106 mmol/L (ref 101–111)
Creatinine, Ser: 0.62 mg/dL (ref 0.44–1.00)
GFR calc Af Amer: >60 mL/min (ref >60)
GFR calc non Af Amer: >60 mL/min (ref >60)
Glucose, Bld: 96 mg/dL (ref 65–99)
Potassium: 4 mmol/L (ref 3.5–5.1)
Sodium: 138 mmol/L (ref 135–145)

## 2016-01-03 LAB — CBC WITH DIFFERENTIAL/PLATELET
Basophils Absolute: 0 10*3/uL (ref 0.0–0.1)
Basophils Relative: 0 %
Eosinophils Absolute: 0 10*3/uL (ref 0.0–0.7)
Eosinophils Relative: 0 %
HCT: 37.7 % (ref 36.0–46.0)
Hemoglobin: 12.3 g/dL (ref 12.0–15.0)
Lymphocytes Relative: 18 %
Lymphs Abs: 1.1 10*3/uL (ref 0.7–4.0)
MCH: 30.4 pg (ref 26.0–34.0)
MCHC: 32.6 g/dL (ref 30.0–36.0)
MCV: 93.3 fL (ref 78.0–100.0)
Monocytes Absolute: 0.4 10*3/uL (ref 0.1–1.0)
Monocytes Relative: 6 %
Neutro Abs: 4.7 10*3/uL (ref 1.7–7.7)
Neutrophils Relative %: 76 %
Platelets: 232 10*3/uL (ref 150–400)
RBC: 4.04 MIL/uL (ref 3.87–5.11)
RDW: 12.8 % (ref 11.5–15.5)
WBC: 6.3 10*3/uL (ref 4.0–10.5)

## 2016-01-03 LAB — TYPE AND SCREEN
ABO/RH(D): O POS
Antibody Screen: NEGATIVE

## 2016-01-03 LAB — ABO/RH: ABO/RH(D): O POS

## 2016-01-03 LAB — I-STAT BETA HCG BLOOD, ED (MC, WL, AP ONLY): HCG, QUANTITATIVE: 26.2 m[IU]/mL — AB (ref <5)

## 2016-01-03 NOTE — ED Provider Notes (Signed)
CSN: [id number]     Arrival date & time 01/03/16  1102 History   First MD Initiated Contact with Patient 01/03/16 1229     Chief Complaint  Patient presents with  . Vaginal Bleeding  . Possible Pregnancy     HPI   30 year old G2 P0 a 1 female presents today with vaginal bleeding in the setting of pregnancy. Patient reports that her LMP was on 11/26/2015. She notes that this morning she woke up with abdominal cramping and vaginal bleeding. She reports symptoms started around 7 AM, and at 10 AM passed what appeared to be a large clot that "looked different than normal blood". Patient believes that she has lost the fetus. Patient notes that she has had previous miscarriage. Patient reports minimal blood loss at this time, she denies significant abdominal discomfort other than the cramping. Patient denies any drug or alcohol use, bleeding disorders, abnormal bruising.    History reviewed. No pertinent past medical history. No past surgical history on file. No family history on file. Social History  Substance Use Topics  . Smoking status: Former Smoker -- 0.50 packs/day    Types: Cigarettes  . Smokeless tobacco: None  . Alcohol Use: Yes     Comment: occas   OB History    Gravida Para Term Preterm AB TAB SAB Ectopic Multiple Living   1              Review of Systems  All other systems reviewed and are negative.  Allergies  Review of patient's allergies indicates no known allergies.  Home Medications   Prior to Admission medications   Medication Sig Start Date End Date Taking? Authorizing Provider  Prenatal Vit-Fe Fumarate-FA (PRENATAL MULTIVITAMIN) TABS tablet Take 1 tablet by mouth daily at 12 noon.   Yes Historical Provider, MD  cyclobenzaprine (FLEXERIL) 10 MG tablet Take 1 tablet (10 mg total) by mouth 2 (two) times daily as needed for muscle spasms. Patient not taking: Reported on 01/03/2016 03/12/15   Ladona MowJoe Mintz, PA-C  ibuprofen (ADVIL,MOTRIN) 800 MG tablet Take 1 tablet (800  mg total) by mouth 3 (three) times daily. Patient not taking: Reported on 01/03/2016 03/12/15   Ladona MowJoe Mintz, PA-C   BP 98/75 mmHg  Pulse 84  Temp(Src) 97.8 F (36.6 C) (Oral)  Resp 18  SpO2 100%  LMP 02/20/2015   Physical Exam  Constitutional: She is oriented to person, place, and time. She appears well-developed and well-nourished.  HENT:  Head: Normocephalic and atraumatic.  Eyes: Conjunctivae are normal. Pupils are equal, round, and reactive to light. Right eye exhibits no discharge. Left eye exhibits no discharge. No scleral icterus.  Neck: Normal range of motion. No JVD present. No tracheal deviation present.  Pulmonary/Chest: Effort normal. No stridor.  Abdominal: Soft. She exhibits no distension and no mass. There is no tenderness. There is no rebound and no guarding.  Genitourinary: Cervix exhibits no motion tenderness, no discharge and no friability. There is bleeding in the vagina.  Cervix closed with small amount of blood in the vaginal vault, no obvious signs of products of conception. No tenderness to palpation in the adnexa  Neurological: She is alert and oriented to person, place, and time. Coordination normal.  Skin: Skin is warm and dry. No rash noted. No erythema. No pallor.  Psychiatric: She has a normal mood and affect. Her behavior is normal. Judgment and thought content normal.  Nursing note and vitals reviewed.   ED Course  Procedures (including critical care time) Labs  Review Labs Reviewed  I-STAT BETA HCG BLOOD, ED ([facility], [facility], [facility] ONLY) - Abnormal; Notable for the following:    I-stat hCG, quantitative 26.2 (*)    All other components within normal limits  CBC WITH DIFFERENTIAL/PLATELET  BASIC METABOLIC PANEL  TYPE AND SCREEN  ABO/RH    Imaging Review No results found. I have personally reviewed and evaluated these images and lab results as part of my medical decision-making.   EKG Interpretation None      MDM   Final diagnoses:  Vaginal bleeding  before [redacted] weeks gestation    Labs:CBC, BMP, type and screen, i-STAT beta-hCG- Rh+, hCG 26.2  Imaging:  Consults:  Therapeutics:  Discharge Meds:   Assessment/Plan: Complete, missed.  30 year old female presents today with vaginal bleeding within the first trimester of pregnancy. Patient has a history of miscarriage. She has very minimal blood loss, no significant abdominal tenderness to palpation. Patient is Rh+ no need for RhoGAM here. Patient will need ultrasound to rule out ectopic and visualization of potential retained products. Patient had a closed cervix, this could likely represent incomplete or missed abortion, ectopic pregnancy. Patient pending ultrasound of the time of shift change. Oncoming provider informed of patient's treatment and plan and disposition. Likely patient to be disposition with 48 hour follow-up for repeat hCG and OB/GYN evaluation. Patient referred to the [facility], she is informed to return immediately to the women's ED if she experiences any new or worsening signs or symptoms. Patient verbalized understanding and agreement today's plan.        Eyvonne Mechanic, PA-C 01/03/16 1531  Blane Ohara, MD 01/03/16 [phone]

## 2016-01-03 NOTE — ED Notes (Signed)
PA at bedside.

## 2016-01-03 NOTE — ED Notes (Signed)
Pt states she is six weeks pregnant and started having cramping and bleeding this morning. Pt in NAD at this time. VSS. Pt c/o passing small blood clots.

## 2016-01-03 NOTE — Discharge Instructions (Signed)
Please follow-up with OBGYN in two days for re-evaluation and repeat testing. If you experience any new or worsening signs or symptoms please go to the [facility] Emergency room immediately.   Miscarriage A miscarriage is the sudden loss of an unborn baby (fetus) before the 20th week of pregnancy. Most miscarriages happen in the first 3 months of pregnancy. Sometimes, it happens before a woman even knows she is pregnant. A miscarriage is also called a "spontaneous miscarriage" or "early pregnancy loss." Having a miscarriage can be an emotional experience. Talk with your caregiver about any questions you may have about miscarrying, the grieving process, and your future pregnancy plans. CAUSES   Problems with the fetal chromosomes that make it impossible for the baby to develop normally. Problems with the baby's genes or chromosomes are most often the result of errors that occur, by chance, as the embryo divides and grows. The problems are not inherited from the parents.  Infection of the cervix or uterus.   Hormone problems.   Problems with the cervix, such as having an incompetent cervix. This is when the tissue in the cervix is not strong enough to hold the pregnancy.   Problems with the uterus, such as an abnormally shaped uterus, uterine fibroids, or congenital abnormalities.   Certain medical conditions.   Smoking, drinking alcohol, or taking illegal drugs.   Trauma.  Often, the cause of a miscarriage is unknown.  SYMPTOMS   Vaginal bleeding or spotting, with or without cramps or pain.  Pain or cramping in the abdomen or lower back.  Passing fluid, tissue, or blood clots from the vagina. DIAGNOSIS  Your caregiver will perform a physical exam. You may also have an ultrasound to confirm the miscarriage. Blood or urine tests may also be ordered. TREATMENT   Sometimes, treatment is not necessary if you naturally pass all the fetal tissue that was in the uterus. If some of the  fetus or placenta remains in the body (incomplete miscarriage), tissue left behind may become infected and must be removed. Usually, a dilation and curettage (D and C) procedure is performed. During a D and C procedure, the cervix is widened (dilated) and any remaining fetal or placental tissue is gently removed from the uterus.  Antibiotic medicines are prescribed if there is an infection. Other medicines may be given to reduce the size of the uterus (contract) if there is a lot of bleeding.  If you have Rh negative blood and your baby was Rh positive, you will need a Rh immunoglobulin shot. This shot will protect any future baby from having Rh blood problems in future pregnancies. HOME CARE INSTRUCTIONS   Your caregiver may order bed rest or may allow you to continue light activity. Resume activity as directed by your caregiver.  Have someone help with home and family responsibilities during this time.   Keep track of the number of sanitary pads you use each day and how soaked (saturated) they are. Write down this information.   Do not use tampons. Do not douche or have sexual intercourse until approved by your caregiver.   Only take over-the-counter or prescription medicines for pain or discomfort as directed by your caregiver.   Do not take aspirin. Aspirin can cause bleeding.   Keep all follow-up appointments with your caregiver.   If you or your partner have problems with grieving, talk to your caregiver or seek counseling to help cope with the pregnancy loss. Allow enough time to grieve before trying to get pregnant  again.  SEEK IMMEDIATE MEDICAL CARE IF:   You have severe cramps or pain in your back or abdomen.  You have a fever.  You pass large blood clots (walnut-sized or larger) ortissue from your vagina. Save any tissue for your caregiver to inspect.   Your bleeding increases.   You have a thick, bad-smelling vaginal discharge.  You become lightheaded, weak, or  you faint.   You have chills.  MAKE SURE YOU:  Understand these instructions.  Will watch your condition.  Will get help right away if you are not doing well or get worse.   This information is not intended to replace advice given to you by your health care provider. Make sure you discuss any questions you have with your health care provider.   Document Released: 02/06/2001 Document Revised: 12/08/2012 Document Reviewed: 10/02/2011 Elsevier Interactive Patient Education [employer]2016 Elsevier Inc.

## 2016-01-03 NOTE — ED Notes (Signed)
Patient transported to Ultrasound 

## 2016-03-02 ENCOUNTER — Encounter: Payer: Self-pay | Admitting: *Deleted

## 2016-03-29 ENCOUNTER — Encounter: Payer: Medicaid Other | Admitting: Obstetrics and Gynecology

## 2016-03-29 LAB — OB RESULTS CONSOLE HEPATITIS B SURFACE ANTIGEN: HEP B S AG: NEGATIVE

## 2016-03-29 LAB — OB RESULTS CONSOLE ABO/RH: RH Type: POSITIVE

## 2016-03-29 LAB — OB RESULTS CONSOLE HIV ANTIBODY (ROUTINE TESTING): HIV: NONREACTIVE

## 2016-03-29 LAB — OB RESULTS CONSOLE RUBELLA ANTIBODY, IGM: Rubella: IMMUNE

## 2016-03-29 LAB — OB RESULTS CONSOLE RPR: RPR: NONREACTIVE

## 2016-03-29 LAB — OB RESULTS CONSOLE GC/CHLAMYDIA
Chlamydia: NEGATIVE
Gonorrhea: NEGATIVE

## 2016-03-29 LAB — OB RESULTS CONSOLE ANTIBODY SCREEN: Antibody Screen: NEGATIVE

## 2016-05-21 ENCOUNTER — Other Ambulatory Visit (HOSPITAL_COMMUNITY): Payer: Self-pay | Admitting: Nurse Practitioner

## 2016-05-23 ENCOUNTER — Encounter (HOSPITAL_COMMUNITY): Payer: Self-pay | Admitting: *Deleted

## 2016-05-24 ENCOUNTER — Ambulatory Visit (HOSPITAL_COMMUNITY)
Admission: RE | Admit: 2016-05-24 | Discharge: 2016-05-24 | Disposition: A | Discharge status: Home / Self Care | Payer: Medicaid Other | Source: Ambulatory Visit | Attending: Nurse Practitioner | Admitting: Nurse Practitioner

## 2016-05-29 ENCOUNTER — Ambulatory Visit (HOSPITAL_COMMUNITY)
Admission: RE | Admit: 2016-05-29 | Discharge: 2016-05-29 | Disposition: A | Discharge status: Home / Self Care | Payer: Medicaid Other | Source: Ambulatory Visit | Attending: Nurse Practitioner | Admitting: Nurse Practitioner

## 2016-05-29 ENCOUNTER — Encounter (HOSPITAL_COMMUNITY): Payer: Self-pay

## 2016-05-29 DIAGNOSIS — O280 Abnormal hematological finding on antenatal screening of mother: Secondary | ICD-10-CM

## 2016-05-29 HISTORY — DX: Abnormal hematological finding on antenatal screening of mother: O28.0

## 2016-05-29 NOTE — Progress Notes (Signed)
Genetic Counseling  High-Risk Gestation Note  Appointment Date:  05/29/2016 Referred By: Currie Paris, NP Date of Birth:  11-22-1985   Pregnancy History: G3P0020 Estimated Date of Delivery: 10/10/16 Estimated Gestational Age: [redacted]w[redacted]d Attending: Particia Nearing, MD    [name] was seen for genetic counseling because of an increased risk for fetal Down syndrome based on Quad screen through [facility].  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  Reviewed results of Quad screening test  Increased risk for Down syndrome 1 in 171 (<1%)  Discussed additional screening options  NIPS-declined  Ultrasound- previously performed at [facility], within normal range according to report  Discussed diagnostic testing options  Amniocentesis-declined  Reviewed exposures during pregnancy- patient reported she discontinued marijuana use in pregnancy yesterday  Reviewed associations of marijuana use in pregnancy and encouraged cessation throughout pregnancy  She was counseled regarding the screening result and the associated 1 in 171 risk for fetal Down syndrome. The patient indicated very limited understanding given her personal history of intellectual disability. We reviewed that Quad screen does not diagnose or rule out Down syndrome, and that the result is also indicating a >99% chance that there is not Down syndrome in the pregnancy.  In very basic terms, we reviewed chromosomes, nondisjunction, and the common features and variable prognosis of Down syndrome.  In addition, we reviewed the screen adjusted reduction in risks for trisomy 18  and open neural tube defects.  We also discussed other explanations for a screen positive result including: a gestational dating error, differences in maternal metabolism, and normal variation.  We reviewed other available screening options including  noninvasive prenatal screening (NIPS)/cell free DNA (cfDNA) screening and detailed ultrasound.  She was counseled that screening tests are used to modify a patient's a priori risk for aneuploidy, typically based on age. This estimate provides a pregnancy specific risk assessment. We reviewed the benefits and limitations of each option. Specifically, we discussed the conditions for which each test screens, the detection rates, and false positive rates of each.  Sarah Foster previously had anatomy ultrasound performed through [facility] on 05/21/16, which was reported to be within normal limits. We reviewed that ultrasound can assess for physical differences associated with Down syndrome but cannot diagnose or rule out Down syndrome in pregnancy. She was also counseled regarding diagnostic testing via amniocentesis. We reviewed the approximate 1 in 300-500 risk for complications from amniocentesis, including spontaneous pregnancy loss. We discussed the possible results that the tests might provide including: positive, negative, unanticipated, and no result. Sarah Foster declined amniocentesis given the associated risk for complications.  After consideration of all the options, she declined all additional screening or testing for Down syndrome (including NIPS), stating that thinking more about the potential for Down syndrome during her pregnancy and/or doing any additional testing or screening would be too stressful for her.  She preferred postnatal evaluation, if warranted at that time. She also indicated that an underlying diagnosis of Down syndrome in pregnancy would not alter the course of the pregnancy for her. We discussed that screening tests cannot rule out all birth defects or genetic syndromes.   Both family histories were briefly reviewed and found to be contributory for personal intellectual disability for the patient. She reported that she has difficulty with reading and writing, and  she is currently taking classes to improve these skills. She reported no additional personal health problems. She  briefly reported that her mother possibly had mental health issues. The patient declined reporting additional family history information. She did not have information regarding family history for the father of the pregnancy. We thus, cannot comment on how the family histories may or may not contribute to the risk for birth defects or genetic conditions in the pregnancy. We spent time discussing that there are many different causes of intellectual disabilities including environmental, multifactorial, and genetic etiologies.  A specific diagnosis for intellectual disability can be determined in approximately 50% of these individuals.  In the remaining 50% of individuals, a diagnosis may never be determined.  Regarding genetic causes, we discussed that chromosome aberrations (aneuploidy, deletions, duplications, insertions, and translocations) are responsible for a small percentage of individuals with intellectual disability.  Many individuals with chromosome aberrations have additional differences, including congenital anomalies or minor dysmorphisms.  Likewise, single gene conditions are the underlying cause of intellectual delay in some families.  We discussed that many gene conditions have intellectual disability as a feature, but also often include other physical or medical differences.  In the case of intellectual disability for a parent, recurrence risk for offspring would be increased, but it is difficult to quantify in the absence of a known etiology.  Without further information regarding the provided family history, an accurate genetic risk cannot be calculated. Further genetic counseling is warranted if more information is obtained.  Sarah Foster denied exposure to environmental toxins or chemical agents. She denied the use of alcohol. She reported smoking marijuana throughout the  pregnancy and stated that she discontinued marijuana use yesterday. The patient reported that she plans continued cessation of marijuana use for the remainder of pregnancy. Every pregnancy carries the risk for congenital anomalies of approximately 3-5%. To show that an agent is teratogenic, the rate of abnormalities for exposed pregnancies must be greater than that expected due to the background risk.  The dosage and timing of an exposure are also very important, with the first trimester of pregnancy being the most critical. We reviewed that available data regarding recreational use of marijuana in pregnancy have not indicated an association with increased risk for birth defects. We discussed that there is a possible association with prenatal marijuana use and decreased fetal growth, according to some available studies. Given this association, we discussed that no marijuana use in pregnancy is recommended. We discussed that detailed ultrasound at approximately [redacted] weeks gestation would be available to assess fetal anatomy and growth in detail. She denied significant viral illnesses during the course of her pregnancy. Her medical and surgical histories were noncontributory.   I counseled Sarah Foster for approximately 50 minutes regarding the above risks and available options. Most of the counseling was provided by Vassie MomentKelly Kemak, [employer] genetic counseling student, under my direct supervision.    Quinn PlowmanKaren Juanangel Soderholm, MS,  Certified Genetic Counselor 05/29/2016

## 2016-06-04 ENCOUNTER — Other Ambulatory Visit (HOSPITAL_COMMUNITY): Payer: Self-pay

## 2016-06-04 ENCOUNTER — Encounter (HOSPITAL_COMMUNITY): Payer: Self-pay

## 2016-08-10 ENCOUNTER — Inpatient Hospital Stay (HOSPITAL_COMMUNITY)
Admission: AD | Admit: 2016-08-10 | Discharge: 2016-08-10 | Disposition: A | Discharge status: Home / Self Care | Payer: Medicaid Other | Source: Ambulatory Visit | Attending: Obstetrics and Gynecology | Admitting: Obstetrics and Gynecology

## 2016-08-10 ENCOUNTER — Encounter (HOSPITAL_COMMUNITY): Payer: Self-pay | Admitting: *Deleted

## 2016-08-10 DIAGNOSIS — R0789 Other chest pain: Secondary | ICD-10-CM | POA: Diagnosis not present

## 2016-08-10 DIAGNOSIS — Z3400 Encounter for supervision of normal first pregnancy, unspecified trimester: Secondary | ICD-10-CM

## 2016-08-10 DIAGNOSIS — Z3A31 31 weeks gestation of pregnancy: Secondary | ICD-10-CM | POA: Diagnosis not present

## 2016-08-10 DIAGNOSIS — K219 Gastro-esophageal reflux disease without esophagitis: Secondary | ICD-10-CM | POA: Diagnosis present

## 2016-08-10 DIAGNOSIS — O26893 Other specified pregnancy related conditions, third trimester: Secondary | ICD-10-CM | POA: Insufficient documentation

## 2016-08-10 DIAGNOSIS — R1110 Vomiting, unspecified: Secondary | ICD-10-CM | POA: Diagnosis present

## 2016-08-10 DIAGNOSIS — K2100 Gastro-esophageal reflux disease with esophagitis, without bleeding: Secondary | ICD-10-CM

## 2016-08-10 DIAGNOSIS — O212 Late vomiting of pregnancy: Secondary | ICD-10-CM | POA: Insufficient documentation

## 2016-08-10 DIAGNOSIS — D069 Carcinoma in situ of cervix, unspecified: Secondary | ICD-10-CM

## 2016-08-10 DIAGNOSIS — Z87891 Personal history of nicotine dependence: Secondary | ICD-10-CM | POA: Diagnosis not present

## 2016-08-10 LAB — URINALYSIS, ROUTINE W REFLEX MICROSCOPIC
BILIRUBIN URINE: NEGATIVE
Glucose, UA: 50 mg/dL — AB
HGB URINE DIPSTICK: NEGATIVE
Ketones, ur: NEGATIVE mg/dL
Leukocytes, UA: NEGATIVE
Nitrite: NEGATIVE
Protein, ur: NEGATIVE mg/dL
Specific Gravity, Urine: 1.009 (ref 1.005–1.030)
pH: 8 (ref 5.0–8.0)

## 2016-08-10 MED ORDER — CALCIUM CARBONATE ANTACID 500 MG PO CHEW: 1.0000 | CHEWABLE_TABLET | Freq: Every day | ORAL | 1 refills | Status: DC

## 2016-08-10 MED ORDER — RANITIDINE HCL 150 MG PO TABS: 150.0000 mg | ORAL_TABLET | Freq: Two times a day (BID) | ORAL | 1 refills | Status: DC | PRN

## 2016-08-10 MED ORDER — GI COCKTAIL ~~LOC~~
30.0000 mL | Freq: Once | ORAL | Status: AC
  Administered 2016-08-10: 30 mL via ORAL
  Filled 2016-08-10: qty 30

## 2016-08-10 NOTE — Discharge Instructions (Signed)
Gastroesophageal Reflux Disease, Adult Normally, food travels down the esophagus and stays in the stomach to be digested. However, when a person has gastroesophageal reflux disease (GERD), food and stomach acid move back up into the esophagus. When this happens, the esophagus becomes sore and inflamed. Over time, GERD can create small holes (ulcers) in the lining of the esophagus. What are the causes? This condition is caused by a problem with the muscle between the esophagus and the stomach (lower esophageal sphincter, or LES). Normally, the LES muscle closes after food passes through the esophagus to the stomach. When the LES is weakened or abnormal, it does not close properly, and that allows food and stomach acid to go back up into the esophagus. The LES can be weakened by certain dietary substances, medicines, and medical conditions, including:  Tobacco use.  Pregnancy.  Having a hiatal hernia.  Heavy alcohol use.  Certain foods and beverages, such as coffee, chocolate, onions, and peppermint.  What increases the risk? This condition is more likely to develop in:  People who have an increased body weight.  People who have connective tissue disorders.  People who use NSAID medicines.  What are the signs or symptoms? Symptoms of this condition include:  Heartburn.  Difficult or painful swallowing.  The feeling of having a lump in the throat.  Abitter taste in the mouth.  Bad breath.  Having a large amount of saliva.  Having an upset or bloated stomach.  Belching.  Chest pain.  Shortness of breath or wheezing.  Ongoing (chronic) cough or a night-time cough.  Wearing away of tooth enamel.  Weight loss.  Different conditions can cause chest pain. Make sure to see your health care provider if you experience chest pain. How is this diagnosed? Your health care provider will take a medical history and perform a physical exam. To determine if you have mild or severe  GERD, your health care provider may also monitor how you respond to treatment. You may also have other tests, including:  An endoscopy toexamine your stomach and esophagus with a small camera.  A test thatmeasures the acidity level in your esophagus.  A test thatmeasures how much pressure is on your esophagus.  A barium swallow or modified barium swallow to show the shape, size, and functioning of your esophagus.  How is this treated? The goal of treatment is to help relieve your symptoms and to prevent complications. Treatment for this condition may vary depending on how severe your symptoms are. Your health care provider may recommend:  Changes to your diet.  Medicine.  Surgery.  Follow these instructions at home: Diet  Follow a diet as recommended by your health care provider. This may involve avoiding foods and drinks such as: ? Coffee and tea (with or without caffeine). ? Drinks that containalcohol. ? Energy drinks and sports drinks. ? Carbonated drinks or sodas. ? Chocolate and cocoa. ? Peppermint and mint flavorings. ? Garlic and onions. ? Horseradish. ? Spicy and acidic foods, including peppers, chili powder, curry powder, vinegar, hot sauces, and barbecue sauce. ? Citrus fruit juices and citrus fruits, such as oranges, lemons, and limes. ? Tomato-based foods, such as red sauce, chili, salsa, and pizza with red sauce. ? Fried and fatty foods, such as donuts, french fries, potato chips, and high-fat dressings. ? High-fat meats, such as hot dogs and fatty cuts of red and white meats, such as rib eye steak, sausage, ham, and bacon. ? High-fat dairy items, such as whole milk,   butter, and cream cheese.  Eat small, frequent meals instead of large meals.  Avoid drinking large amounts of liquid with your meals.  Avoid eating meals during the 2-3 hours before bedtime.  Avoid lying down right after you eat.  Do not exercise right after you eat. General  instructions  Pay attention to any changes in your symptoms.  Take over-the-counter and prescription medicines only as told by your health care provider. Do not take aspirin, ibuprofen, or other NSAIDs unless your health care provider told you to do so.  Do not use any tobacco products, including cigarettes, chewing tobacco, and e-cigarettes. If you need help quitting, ask your health care provider.  Wear loose-fitting clothing. Do not wear anything tight around your waist that causes pressure on your abdomen.  Raise (elevate) the head of your bed 6 inches (15cm).  Try to reduce your stress, such as with yoga or meditation. If you need help reducing stress, ask your health care provider.  If you are overweight, reduce your weight to an amount that is healthy for you. Ask your health care provider for guidance about a safe weight loss goal.  Keep all follow-up visits as told by your health care provider. This is important. Contact a health care provider if:  You have new symptoms.  You have unexplained weight loss.  You have difficulty swallowing, or it hurts to swallow.  You have wheezing or a persistent cough.  Your symptoms do not improve with treatment.  You have a hoarse voice. Get help right away if:  You have pain in your arms, neck, jaw, teeth, or back.  You feel sweaty, dizzy, or light-headed.  You have chest pain or shortness of breath.  You vomit and your vomit looks like blood or coffee grounds.  You faint.  Your stool is bloody or black.  You cannot swallow, drink, or eat. This information is not intended to replace advice given to you by your health care provider. Make sure you discuss any questions you have with your health care provider. Document Released: 05/23/2005 Document Revised: 01/11/2016 Document Reviewed: 12/08/2014 Elsevier Interactive Patient Education  2017 Elsevier Inc.  

## 2016-08-10 NOTE — MAU Note (Signed)
Pt presents to MAU with complaints of pain in her chest from vomiting this morning. Pt states she did cough up a small amount of blood this morning. Denies any abdominal cramping or abnormal discharge

## 2016-08-10 NOTE — MAU Provider Note (Signed)
Chief Complaint:  Emesis   First Provider Initiated Contact with Patient 08/10/16 1311     HPI: Sarah Foster is a 30 y.o. G3P0020 at 3031w2dwho presents to maternity admissions reporting pressure and discomfort in her chest after vomiting this morning. Feels sure it is related to that.  Denies problems breathing or other symptoms.  Has had problems with acid reflux.  Wants a med for that She reports good fetal movement, denies LOF, vaginal bleeding, vaginal itching/burning, urinary symptoms, h/a, dizziness, n/v, diarrhea, constipation or fever/chills.   Emesis   This is a recurrent problem. The current episode started yesterday. The problem occurs less than 2 times per day. The problem has been unchanged. The emesis has an appearance of stomach contents and bright red blood (speck of blood). There has been no fever. Associated symptoms include chest pain (describes as pressure after vomiting). Pertinent negatives include no abdominal pain, chills, diarrhea, dizziness, fever or headaches. She has tried nothing for the symptoms.   RN Note: Pt presents to MAU with complaints of pain in her chest from vomiting this morning. Pt states she did cough up a small amount of blood this morning. Denies any abdominal cramping or abnormal discharge  Past Medical History: History reviewed. No pertinent past medical history.  Past obstetric history: OB History  Gravida Para Term Preterm AB Living  3       2    SAB TAB Ectopic Multiple Live Births  2            # Outcome Date GA Lbr Len/2nd Weight Sex Delivery Anes PTL Lv  3 Current           2 SAB           1 SAB               Past Surgical History: History reviewed. No pertinent surgical history.  Family History: History reviewed. No pertinent family history.  Social History: Social History  Substance Use Topics  . Smoking status: Former Smoker    Packs/day: 0.50    Types: Cigarettes  . Smokeless tobacco: Former NeurosurgeonUser  . Alcohol use Yes      Comment: occas    Allergies: No Known Allergies  Meds:  Prescriptions Prior to Admission  Medication Sig Dispense Refill Last Dose  . cyclobenzaprine (FLEXERIL) 10 MG tablet Take 1 tablet (10 mg total) by mouth 2 (two) times daily as needed for muscle spasms. (Patient not taking: Reported on 01/03/2016) 20 tablet 0 Not Taking at Unknown time  . ibuprofen (ADVIL,MOTRIN) 800 MG tablet Take 1 tablet (800 mg total) by mouth 3 (three) times daily. (Patient not taking: Reported on 01/03/2016) 21 tablet 0 Not Taking at Unknown time  . Prenatal Vit-Fe Fumarate-FA (PRENATAL MULTIVITAMIN) TABS tablet Take 1 tablet by mouth daily at 12 noon.   01/03/2016 at Unknown time    I have reviewed patient's Past Medical Hx, Surgical Hx, Family Hx, Social Hx, medications and allergies.   ROS:  Review of Systems  Constitutional: Negative for chills and fever.  Cardiovascular: Positive for chest pain (describes as pressure after vomiting).  Gastrointestinal: Positive for vomiting. Negative for abdominal pain and diarrhea.  Neurological: Negative for dizziness and headaches.   Other systems negative  Physical Exam  Patient Vitals for the past 24 hrs:  BP Temp Pulse Resp Height Weight  08/10/16 1239 114/68 97.5 F (36.4 C) 96 18 5\' 6"  (1.676 m) 171 lb (77.6 kg)   Constitutional: Well-developed,  well-nourished female in no acute distress.  Cardiovascular: normal rate and rhythm Respiratory: normal effort, clear to auscultation bilaterally GI: Abd soft, non-tender, gravid appropriate for gestational age.   No rebound or guarding. MS: Extremities nontender, no edema, normal ROM Neurologic: Alert and oriented x 4.  GU: Neg CVAT.  PELVIC EXAM: not indicated  FHT:  Baseline 140 , moderate variability, accelerations present, no decelerations Contractions:  Rare   Labs: No results found for this or any previous visit (from the past 24 hour(s)). --/--/O POS, O POS (05/09 1309)  Imaging:  No results  found.  MAU Course/MDM: I have ordered labs and reviewed results. Offered EKG, declined. I do not feel that this is cardiac related, especially since the GI cocktail removed symptom entirely NST reviewed We gave her a GI cocktail which entirely stopped her symptoms.  Pressure is all gone Wants anti-acid med  Assessment: SIUP at 7028w5d Chest pressure after vomiting, likely esophagitis Probable mallory-weiss injury from vomiting, no vomiting entire MAU stay  Plan: Discharge home Rx Zantac for anti-acid activity Also may use TUMS PRN incidentally Preterm Labor precautions and fetal kick counts Follow up in Office for prenatal visits and recheck of progress  Encouraged to return here or to other Urgent Care/ED if she develops worsening of symptoms, increase in pain, fever, or other concerning symptoms.   Pt stable at time of discharge.  Wynelle BourgeoisMarie Dontavion Noxon CNM, MSN Certified Nurse-Midwife 08/10/2016 1:26 PM

## 2016-08-27 NOTE — L&D Delivery Note (Signed)
Patient is 31 y.o. [id number] 221w1d admitted for IOL for postdates.    Delivery Note At 9:36 PM a viable female was delivered via Vaginal, Spontaneous Delivery (Presentation: Right Occiput Anterior) with compound right hand.  APGAR: 8, ; weight pending.   Placenta status: intact.  Cord: 3 vessel with the following complications: none.    Anesthesia:  Epidural  Episiotomy: None Lacerations:  None  Est. Blood Loss (mL): 75  Mom to postpartum.  Baby to Couplet care / Skin to Skin.  [name] 10/18/2016, 9:46 PM  The above was performed under my direct supervision and guidance.

## 2016-09-19 LAB — OB RESULTS CONSOLE GBS: GBS: POSITIVE

## 2016-09-19 LAB — OB RESULTS CONSOLE GC/CHLAMYDIA
CHLAMYDIA, DNA PROBE: NEGATIVE
Gonorrhea: NEGATIVE

## 2016-10-10 ENCOUNTER — Encounter (HOSPITAL_COMMUNITY): Payer: Self-pay

## 2016-10-10 ENCOUNTER — Inpatient Hospital Stay (HOSPITAL_COMMUNITY)
Admission: AD | Admit: 2016-10-10 | Discharge: 2016-10-10 | Disposition: A | Discharge status: Home / Self Care | Payer: Medicaid Other | Source: Ambulatory Visit | Attending: Obstetrics & Gynecology | Admitting: Obstetrics & Gynecology

## 2016-10-10 DIAGNOSIS — Z79899 Other long term (current) drug therapy: Secondary | ICD-10-CM | POA: Diagnosis not present

## 2016-10-10 DIAGNOSIS — O26893 Other specified pregnancy related conditions, third trimester: Secondary | ICD-10-CM | POA: Diagnosis not present

## 2016-10-10 DIAGNOSIS — O280 Abnormal hematological finding on antenatal screening of mother: Secondary | ICD-10-CM

## 2016-10-10 DIAGNOSIS — K219 Gastro-esophageal reflux disease without esophagitis: Secondary | ICD-10-CM | POA: Insufficient documentation

## 2016-10-10 DIAGNOSIS — Z3A31 31 weeks gestation of pregnancy: Secondary | ICD-10-CM | POA: Diagnosis not present

## 2016-10-10 DIAGNOSIS — R079 Chest pain, unspecified: Secondary | ICD-10-CM | POA: Insufficient documentation

## 2016-10-10 LAB — POCT FERN TEST: POCT Fern Test: NEGATIVE

## 2016-10-10 NOTE — MAU Note (Signed)
Pt presents complaining of contractions since 10pm. Also thinks she's been leaking water for 2 days. Denies bleeding. Reports good fetal movement.

## 2016-10-11 ENCOUNTER — Telehealth (HOSPITAL_COMMUNITY): Payer: Self-pay | Admitting: *Deleted

## 2016-10-11 NOTE — Telephone Encounter (Signed)
Preadmission screen  

## 2016-10-17 ENCOUNTER — Other Ambulatory Visit: Payer: Self-pay | Admitting: Advanced Practice Midwife

## 2016-10-18 ENCOUNTER — Encounter (HOSPITAL_COMMUNITY): Payer: Self-pay

## 2016-10-18 ENCOUNTER — Inpatient Hospital Stay (HOSPITAL_COMMUNITY): Payer: Medicaid Other | Admitting: Anesthesiology

## 2016-10-18 ENCOUNTER — Inpatient Hospital Stay (HOSPITAL_COMMUNITY)
Admission: RE | Admit: 2016-10-18 | Discharge: 2016-10-20 | DRG: 775 | Disposition: A | Discharge status: Home / Self Care | Payer: Medicaid Other | Source: Ambulatory Visit | Attending: Obstetrics and Gynecology | Admitting: Obstetrics and Gynecology

## 2016-10-18 DIAGNOSIS — Z3A41 41 weeks gestation of pregnancy: Secondary | ICD-10-CM

## 2016-10-18 DIAGNOSIS — O99824 Streptococcus B carrier state complicating childbirth: Secondary | ICD-10-CM | POA: Diagnosis present

## 2016-10-18 DIAGNOSIS — O99324 Drug use complicating childbirth: Secondary | ICD-10-CM | POA: Diagnosis present

## 2016-10-18 DIAGNOSIS — Z87891 Personal history of nicotine dependence: Secondary | ICD-10-CM | POA: Diagnosis not present

## 2016-10-18 DIAGNOSIS — K219 Gastro-esophageal reflux disease without esophagitis: Secondary | ICD-10-CM | POA: Diagnosis present

## 2016-10-18 DIAGNOSIS — F1210 Cannabis abuse, uncomplicated: Secondary | ICD-10-CM

## 2016-10-18 DIAGNOSIS — F1290 Cannabis use, unspecified, uncomplicated: Secondary | ICD-10-CM | POA: Diagnosis present

## 2016-10-18 DIAGNOSIS — O280 Abnormal hematological finding on antenatal screening of mother: Secondary | ICD-10-CM

## 2016-10-18 DIAGNOSIS — O480 Post-term pregnancy: Principal | ICD-10-CM | POA: Diagnosis present

## 2016-10-18 DIAGNOSIS — O9962 Diseases of the digestive system complicating childbirth: Secondary | ICD-10-CM | POA: Diagnosis present

## 2016-10-18 HISTORY — DX: Gastro-esophageal reflux disease without esophagitis: K21.9

## 2016-10-18 HISTORY — DX: Anxiety disorder, unspecified: F41.9

## 2016-10-18 HISTORY — DX: Unspecified abnormal cytological findings in specimens from vagina: R87.629

## 2016-10-18 LAB — CBC
HEMATOCRIT: 31.8 % — AB (ref 36.0–46.0)
Hemoglobin: 11.1 g/dL — ABNORMAL LOW (ref 12.0–15.0)
MCH: 31.6 pg (ref 26.0–34.0)
MCHC: 34.9 g/dL (ref 30.0–36.0)
MCV: 90.6 fL (ref 78.0–100.0)
Platelets: 248 10*3/uL (ref 150–400)
RBC: 3.51 MIL/uL — ABNORMAL LOW (ref 3.87–5.11)
RDW: 14.2 % (ref 11.5–15.5)
WBC: 7.9 10*3/uL (ref 4.0–10.5)

## 2016-10-18 LAB — TYPE AND SCREEN
ABO/RH(D): O POS
ANTIBODY SCREEN: NEGATIVE

## 2016-10-18 MED ORDER — PHENYLEPHRINE 40 MCG/ML (10ML) SYRINGE FOR IV PUSH (FOR BLOOD PRESSURE SUPPORT)
80.0000 ug | PREFILLED_SYRINGE | INTRAVENOUS | Status: DC | PRN
  Filled 2016-10-18: qty 10
  Filled 2016-10-18: qty 5

## 2016-10-18 MED ORDER — LIDOCAINE HCL (PF) 1 % IJ SOLN
INTRAMUSCULAR | Status: DC | PRN
  Administered 2016-10-18 (×2): 5 mL

## 2016-10-18 MED ORDER — OXYTOCIN 40 UNITS IN LACTATED RINGERS INFUSION - SIMPLE MED
1.0000 m[IU]/min | INTRAVENOUS | Status: DC
  Administered 2016-10-18: 2 m[IU]/min via INTRAVENOUS
  Filled 2016-10-18: qty 1000

## 2016-10-18 MED ORDER — OXYTOCIN BOLUS FROM INFUSION
500.0000 mL | Freq: Once | INTRAVENOUS | Status: AC
  Administered 2016-10-18: 500 mL via INTRAVENOUS

## 2016-10-18 MED ORDER — PENICILLIN G POTASSIUM 5000000 UNITS IJ SOLR
5.0000 10*6.[IU] | Freq: Once | INTRAVENOUS | Status: AC
  Administered 2016-10-18: 5 10*6.[IU] via INTRAVENOUS
  Filled 2016-10-18: qty 5

## 2016-10-18 MED ORDER — TERBUTALINE SULFATE 1 MG/ML IJ SOLN
0.2500 mg | Freq: Once | INTRAMUSCULAR | Status: DC | PRN
  Filled 2016-10-18: qty 1

## 2016-10-18 MED ORDER — FENTANYL CITRATE (PF) 100 MCG/2ML IJ SOLN: 100.0000 ug | INTRAMUSCULAR | Status: DC | PRN

## 2016-10-18 MED ORDER — OXYCODONE-ACETAMINOPHEN 5-325 MG PO TABS: 1.0000 | ORAL_TABLET | ORAL | Status: DC | PRN

## 2016-10-18 MED ORDER — FENTANYL 2.5 MCG/ML BUPIVACAINE 1/10 % EPIDURAL INFUSION (WH - ANES)
14.0000 mL/h | INTRAMUSCULAR | Status: DC | PRN
  Administered 2016-10-18 (×2): 14 mL/h via EPIDURAL
  Filled 2016-10-18 (×2): qty 100

## 2016-10-18 MED ORDER — OXYCODONE-ACETAMINOPHEN 5-325 MG PO TABS
2.0000 | ORAL_TABLET | ORAL | Status: DC | PRN
Start: 2016-10-18 — End: 2016-10-19

## 2016-10-18 MED ORDER — EPHEDRINE 5 MG/ML INJ
10.0000 mg | INTRAVENOUS | Status: DC | PRN
  Filled 2016-10-18: qty 4

## 2016-10-18 MED ORDER — LACTATED RINGERS IV SOLN
INTRAVENOUS | Status: DC
  Administered 2016-10-18 (×3): via INTRAVENOUS

## 2016-10-18 MED ORDER — PHENYLEPHRINE 40 MCG/ML (10ML) SYRINGE FOR IV PUSH (FOR BLOOD PRESSURE SUPPORT)
80.0000 ug | PREFILLED_SYRINGE | INTRAVENOUS | Status: DC | PRN
  Filled 2016-10-18: qty 5

## 2016-10-18 MED ORDER — DIPHENHYDRAMINE HCL 50 MG/ML IJ SOLN: 12.5000 mg | INTRAMUSCULAR | Status: DC | PRN

## 2016-10-18 MED ORDER — LACTATED RINGERS IV SOLN: 500.0000 mL | INTRAVENOUS | Status: DC | PRN

## 2016-10-18 MED ORDER — SOD CITRATE-CITRIC ACID 500-334 MG/5ML PO SOLN
30.0000 mL | ORAL | Status: DC | PRN
  Filled 2016-10-18: qty 15

## 2016-10-18 MED ORDER — LIDOCAINE HCL (PF) 1 % IJ SOLN
30.0000 mL | INTRAMUSCULAR | Status: DC | PRN
  Filled 2016-10-18: qty 30

## 2016-10-18 MED ORDER — LACTATED RINGERS IV SOLN
500.0000 mL | Freq: Once | INTRAVENOUS | Status: AC
  Administered 2016-10-18: 1000 mL via INTRAVENOUS

## 2016-10-18 MED ORDER — PENICILLIN G POT IN DEXTROSE 60000 UNIT/ML IV SOLN
3.0000 10*6.[IU] | INTRAVENOUS | Status: DC
  Administered 2016-10-18: 3 10*6.[IU] via INTRAVENOUS
  Filled 2016-10-18 (×7): qty 50

## 2016-10-18 MED ORDER — MISOPROSTOL 25 MCG QUARTER TABLET
25.0000 ug | ORAL_TABLET | ORAL | Status: DC | PRN
  Filled 2016-10-18: qty 0.25
  Filled 2016-10-18: qty 1

## 2016-10-18 MED ORDER — ACETAMINOPHEN 325 MG PO TABS: 650.0000 mg | ORAL_TABLET | ORAL | Status: DC | PRN

## 2016-10-18 MED ORDER — ONDANSETRON HCL 4 MG/2ML IJ SOLN: 4.0000 mg | Freq: Four times a day (QID) | INTRAMUSCULAR | Status: DC | PRN

## 2016-10-18 MED ORDER — OXYTOCIN 40 UNITS IN LACTATED RINGERS INFUSION - SIMPLE MED: 2.5000 [IU]/h | INTRAVENOUS | Status: DC

## 2016-10-18 NOTE — Anesthesia Pain Management Evaluation Note (Signed)
  CRNA Pain Management Visit Note  Patient: Sarah MaizeLatoya M Grace, 31 y.o., female  "Hello I am a [facility]. We have an anesthesia team available at all times to provide care throughout the hospital, including epidural management and anesthesia for C-section. I don't know your plan for the delivery whether it a natural birth, water birth, IV sedation, nitrous supplementation, doula or epidural, but we want to meet your pain goals."   1.Was your pain managed to your expectations on prior hospitalizations?   No prior hospitalizations  2.What is your expectation for pain management during this hospitalization?     Epidural  3.How can we help you reach that goal? epidural  Record the patient's initial score and the patient's pain goal.   Pain: 7  Pain Goal: 8 [facility] wants you to be able to say your pain was always managed very well.  Madison HickmanGREGORY,Marya Lowden 10/18/2016

## 2016-10-18 NOTE — H&P (Signed)
Sarah Foster is a 31 y.o. female presenting for induction of labor for postterm pregnancy.  [facility]  Pregnancy dated by 12 wk ultrasound.  Pregnancy uncomplicated other than increased DSR 1:171.  Declined NIPS, anatomy ultrasound normal.  Patient reports positive marijuana use to hep with stress.  Verbalized desire to quit now that baby will be born. OB History    Gravida Para Term Preterm AB Living   3 0 0 0 2 0   SAB TAB Ectopic Multiple Live Births   2 0 0 0 0     Past Medical History:  Diagnosis Date  . Anxiety   . GERD (gastroesophageal reflux disease)    TUMS  . Vaginal Pap smear, abnormal 2017   pt stated needs colposcopy after delivery   Past Surgical History:  Procedure Laterality Date  . NO PAST SURGERIES     Family History: family history is not on file. Social History:  reports that she has quit smoking. Her smoking use included Cigarettes. She smoked 0.50 packs per day. She has quit using smokeless tobacco. She reports that she drinks alcohol. She reports that she uses drugs, including Marijuana.     Maternal Diabetes: No Genetic Screening: Abnormal:  Results: Elevated risk of Trisomy 21 Maternal Ultrasounds/Referrals: Normal Fetal Ultrasounds or other Referrals:  None Maternal Substance Abuse:  Yes:  Type: Marijuana Significant Maternal Medications:  None Significant Maternal Lab Results:  Lab values include: Group B Strep positive Other Comments:  None  ROS Maternal Medical History:  Contractions: Onset was 6-12 hours ago.   Frequency: irregular.   Perceived severity is moderate.    Fetal activity: Perceived fetal activity is normal.   Last perceived fetal movement was within the past hour.    Prenatal complications: no prenatal complications Prenatal Complications - Diabetes: none.    Dilation: 3 Effacement (%): 80 Station: -2 Exam by:: Enis SlipperJane Bailey, RN Blood pressure 110/78, pulse 87, temperature 97.7 F (36.5 C),  temperature source Oral, resp. rate 20, height 5\' 5"  (1.651 m), weight 175 lb (79.4 kg), last menstrual period 01/04/2016. Maternal Exam:  Uterine Assessment: Contraction strength is moderate.  Contraction frequency is irregular.   Abdomen: Patient reports no abdominal tenderness. Fetal presentation: vertex     Physical Exam  Constitutional: She is oriented to person, place, and time. She appears well-developed and well-nourished.  HENT:  Head: Normocephalic.  Neck: Normal range of motion. Neck supple.  Cardiovascular: Normal rate, regular rhythm and normal heart sounds.   Respiratory: Effort normal and breath sounds normal. No respiratory distress.  GI: Soft. There is no tenderness.  Genitourinary: No bleeding in the vagina.  Musculoskeletal: Normal range of motion. She exhibits no edema.  Neurological: She is alert and oriented to person, place, and time.  Skin: Skin is warm and dry.    Prenatal labs: ABO, Rh: O/Positive/-- (08/03 0000) Antibody: Negative (08/03 0000) Rubella: Immune (08/03 0000) RPR: Nonreactive (08/03 0000)  HBsAg: Negative (08/03 0000)  HIV: Non-reactive (08/03 0000)  GBS: Positive (01/24 0000)   Assessment/Plan: 31 y.o. G3P0020 at 7246w1d IUP Post-term Induction of labor GBS Pos Increased DSR risk  [facility]: [facility] Begin pitocin augmentation PCN for GBS  Epidural per patient request  Rochele PagesWalidah Karim 10/18/2016, 9:01 AM

## 2016-10-18 NOTE — Progress Notes (Signed)
Did not receive return call from Infection Control. Have called all available numbers given. Reaching voicemails only & leaving messages. One number was for cell phone for Infection Control nurse, Diannia RuderKara. This RN called her & she stated she is on vacation. Was able to reach Infection Control Nurse on call. Informed her that patient stated that she "has never had MRSA, or a wound, especially not an infected wound".  Told IC nurse that MRSA header has January 2012 date. IC nurse stated that since EPIC went live in 2012, it may have been entered due to a routine PCR swab, but never taken out, even with negative result. Stated many MRSA notes entered in 2012 appear in this fashion. Stated can be removed in the morning when she makes rounds.  Have informed Synthia InnocentFran Crez-Dishmon, CNM, & [place] Vista[place], [place]CNM.

## 2016-10-18 NOTE — Progress Notes (Signed)
AROM w/scant fluid.  cx 8/100/0 sta.  FHR Cat 1.  Pitocin @ 22 mu/min.  Ctx q 2-4 minutes.

## 2016-10-18 NOTE — Anesthesia Procedure Notes (Signed)
Epidural Patient location during procedure: OB  Staffing Anesthesiologist: Florence Antonelli Performed: anesthesiologist   Preanesthetic Checklist Completed: patient identified, site marked, surgical consent, pre-op evaluation, timeout performed, IV checked, risks and benefits discussed and monitors and equipment checked  Epidural Patient position: sitting Prep: DuraPrep Patient monitoring: heart rate, continuous pulse ox and blood pressure Approach: right paramedian Location: L3-L4 Injection technique: LOR saline  Needle:  Needle type: Tuohy  Needle gauge: 17 G Needle length: 9 cm and 9 Needle insertion depth: 7 cm Catheter type: closed end flexible Catheter size: 20 Guage Catheter at skin depth: 11 cm Test dose: negative  Assessment Events: blood not aspirated, injection not painful, no injection resistance, negative IV test and no paresthesia  Additional Notes Patient identified. Risks/Benefits/Options discussed with patient including but not limited to bleeding, infection, nerve damage, paralysis, failed block, incomplete pain control, headache, blood pressure changes, nausea, vomiting, reactions to medication both or allergic, itching and postpartum back pain. Confirmed with bedside nurse the patient's most recent platelet count. Confirmed with patient that they are not currently taking any anticoagulation, have any bleeding history or any family history of bleeding disorders. Patient expressed understanding and wished to proceed. All questions were answered. Sterile technique was used throughout the entire procedure. Please see nursing notes for vital signs. Test dose was given through epidural needle and negative prior to continuing to dose epidural or start infusion. Warning signs of high block given to the patient including shortness of breath, tingling/numbness in hands, complete motor block, or any concerning symptoms with instructions to call for help. Patient was given  instructions on fall risk and not to get out of bed. All questions and concerns addressed with instructions to call with any issues.     

## 2016-10-18 NOTE — Progress Notes (Signed)
   Subjective: Pt reports comfortable after epidural.  No questions or concerns.  Objective: BP 113/75   Pulse 77   Temp 97.4 F (36.3 C) (Oral)   Resp 20   Ht 5\' 5"  (1.651 m)   Wt 175 lb (79.4 kg)   LMP 01/04/2016   BMI 29.12 kg/m  No intake/output data recorded. No intake/output data recorded.  FHT:  FHR: 135 bpm, variability: moderate,  accelerations:  Present,  decelerations:  Absent UC:   regular, every 2-3 minutes SVE:   Dilation: 4 Effacement (%): 80 Station: -2 Exam by:: Enis SlipperJane Bailey, RN  Labs: Lab Results  Component Value Date   WBC 7.9 10/18/2016   HGB 11.1 (L) 10/18/2016   HCT 31.8 (L) 10/18/2016   MCV 90.6 10/18/2016   PLT 248 10/18/2016    Assessment / Plan: Augmentation of labor, progressing well  Labor: Progressing normally Preeclampsia:  n/a Fetal Wellbeing:  Category I Pain Control:  Epidural I/D:  GBS pos Anticipated MOD:  NSVD  Rochele PagesWalidah Karim 10/18/2016, 1:24 PM

## 2016-10-18 NOTE — Progress Notes (Signed)
Patient was asked by this and another RN concerning MRSA,  noted on EPIC header.  Patient states she has never had MRSA. States she has never had a wound which became infected.  This RN called Infection Control and left message at 0915 concerning this, and how to change status. Awaiting return call.

## 2016-10-18 NOTE — Anesthesia Preprocedure Evaluation (Signed)

## 2016-10-19 LAB — RPR: RPR: NONREACTIVE

## 2016-10-19 MED ORDER — METHYLERGONOVINE MALEATE 0.2 MG PO TABS: 0.2000 mg | ORAL_TABLET | ORAL | Status: DC | PRN

## 2016-10-19 MED ORDER — WITCH HAZEL-GLYCERIN EX PADS: 1.0000 "application " | MEDICATED_PAD | CUTANEOUS | Status: DC | PRN

## 2016-10-19 MED ORDER — ZOLPIDEM TARTRATE 5 MG PO TABS: 5.0000 mg | ORAL_TABLET | Freq: Every evening | ORAL | Status: DC | PRN

## 2016-10-19 MED ORDER — DIPHENHYDRAMINE HCL 25 MG PO CAPS: 25.0000 mg | ORAL_CAPSULE | Freq: Four times a day (QID) | ORAL | Status: DC | PRN

## 2016-10-19 MED ORDER — ONDANSETRON HCL 4 MG/2ML IJ SOLN: 4.0000 mg | INTRAMUSCULAR | Status: DC | PRN

## 2016-10-19 MED ORDER — IBUPROFEN 600 MG PO TABS
600.0000 mg | ORAL_TABLET | Freq: Four times a day (QID) | ORAL | Status: DC
  Administered 2016-10-19 – 2016-10-20 (×6): 600 mg via ORAL
  Filled 2016-10-19 (×6): qty 1

## 2016-10-19 MED ORDER — MEASLES, MUMPS & RUBELLA VAC ~~LOC~~ INJ
0.5000 mL | INJECTION | Freq: Once | SUBCUTANEOUS | Status: DC
  Filled 2016-10-19: qty 0.5

## 2016-10-19 MED ORDER — FLEET ENEMA 7-19 GM/118ML RE ENEM: 1.0000 | ENEMA | Freq: Every day | RECTAL | Status: DC | PRN

## 2016-10-19 MED ORDER — ONDANSETRON HCL 4 MG PO TABS: 4.0000 mg | ORAL_TABLET | ORAL | Status: DC | PRN

## 2016-10-19 MED ORDER — FERROUS SULFATE 325 (65 FE) MG PO TABS
325.0000 mg | ORAL_TABLET | Freq: Two times a day (BID) | ORAL | Status: DC
  Administered 2016-10-19 – 2016-10-20 (×3): 325 mg via ORAL
  Filled 2016-10-19 (×4): qty 1

## 2016-10-19 MED ORDER — TETANUS-DIPHTH-ACELL PERTUSSIS 5-2.5-18.5 LF-MCG/0.5 IM SUSP
0.5000 mL | Freq: Once | INTRAMUSCULAR | Status: AC
  Administered 2016-10-20: 0.5 mL via INTRAMUSCULAR
  Filled 2016-10-19: qty 0.5

## 2016-10-19 MED ORDER — OXYCODONE HCL 5 MG PO TABS
5.0000 mg | ORAL_TABLET | ORAL | Status: DC | PRN
  Administered 2016-10-20: 5 mg via ORAL
  Filled 2016-10-19: qty 1

## 2016-10-19 MED ORDER — METHYLERGONOVINE MALEATE 0.2 MG/ML IJ SOLN: 0.2000 mg | INTRAMUSCULAR | Status: DC | PRN

## 2016-10-19 MED ORDER — PRENATAL MULTIVITAMIN CH
1.0000 | ORAL_TABLET | Freq: Every day | ORAL | Status: DC
  Administered 2016-10-19 – 2016-10-20 (×2): 1 via ORAL
  Filled 2016-10-19 (×2): qty 1

## 2016-10-19 MED ORDER — ACETAMINOPHEN 325 MG PO TABS
650.0000 mg | ORAL_TABLET | ORAL | Status: DC | PRN
  Administered 2016-10-20: 650 mg via ORAL
  Filled 2016-10-19: qty 2

## 2016-10-19 MED ORDER — DOCUSATE SODIUM 100 MG PO CAPS
100.0000 mg | ORAL_CAPSULE | Freq: Two times a day (BID) | ORAL | Status: DC
  Administered 2016-10-19 – 2016-10-20 (×3): 100 mg via ORAL
  Filled 2016-10-19 (×3): qty 1

## 2016-10-19 MED ORDER — COCONUT OIL OIL: 1.0000 "application " | TOPICAL_OIL | Status: DC | PRN

## 2016-10-19 MED ORDER — DIBUCAINE 1 % RE OINT
1.0000 | TOPICAL_OINTMENT | RECTAL | Status: DC | PRN
Start: 2016-10-19 — End: 2016-10-20

## 2016-10-19 MED ORDER — SIMETHICONE 80 MG PO CHEW: 80.0000 mg | CHEWABLE_TABLET | ORAL | Status: DC | PRN

## 2016-10-19 MED ORDER — OXYCODONE HCL 5 MG PO TABS: 10.0000 mg | ORAL_TABLET | ORAL | Status: DC | PRN

## 2016-10-19 MED ORDER — BISACODYL 10 MG RE SUPP
10.0000 mg | Freq: Every day | RECTAL | Status: DC | PRN
Start: 2016-10-19 — End: 2016-10-20

## 2016-10-19 MED ORDER — BENZOCAINE-MENTHOL 20-0.5 % EX AERO: 1.0000 "application " | INHALATION_SPRAY | CUTANEOUS | Status: DC | PRN

## 2016-10-19 NOTE — Lactation Note (Signed)
This note was copied from a baby's chart. Lactation Consultation Note Mom BF after delivery, saying she was going to BF. [facility] RN reviewed LEAD. Mom stated she was going to pump and bottle. LC visit rm. Introduced myself, discussed w/mom pumping and formula feeding. Asked mom is that her preference now instead of breast feeding.  Mom stated I'm just going to formula feed. Informed mom there was a DEBP in her rm. And if she would like for LC to set up pump I would. Mom stated no, she didn't have any milk. Educated on BM coming in 3-5 days, has colostrum. Would pump for stimulation, would give colostrum, supplement w/formula as needed.  Mom stated no she was just formula feeding. Reported to RN. Patient Name: Sarah Foster JYNWG'NToday's Date: 10/19/2016     Maternal Data    Feeding Feeding Type: Formula Nipple Type: Slow - flow  LATCH Score/Interventions                      Lactation Tools Discussed/Used     Consult Status      [name] G 10/19/2016, 5:08 AM

## 2016-10-19 NOTE — Anesthesia Postprocedure Evaluation (Signed)
Anesthesia Post Note  Patient: Sarah MaizeLatoya M Birenbaum  Procedure(s) Performed: * No procedures listed *  Patient location during evaluation: Mother Baby Anesthesia Type: Epidural Level of consciousness: awake and alert, oriented and patient cooperative Pain management: pain level controlled Vital Signs Assessment: post-procedure vital signs reviewed and stable Respiratory status: spontaneous breathing Cardiovascular status: stable Postop Assessment: no headache, epidural receding, patient able to bend at knees and no signs of nausea or vomiting Anesthetic complications: no Comments: Pain score 0.        Last Vitals:  Vitals:   10/19/16 0041 10/19/16 0441  BP: 123/74 116/76  Pulse: 67 78  Resp: 16 18  Temp: 36.6 C 36.5 C    Last Pain:  Vitals:   10/19/16 0441  TempSrc: Oral  PainSc:    Pain Goal: Patients Stated Pain Goal: 8 (10/18/16 [id number])               Merrilyn PumaWRINKLE,Samyrah Bruster

## 2016-10-19 NOTE — Progress Notes (Signed)
Post Partum Day 1 Subjective: no complaints, up ad lib, voiding and tolerating PO  Objective: Blood pressure 116/76, pulse 78, temperature 97.7 F (36.5 C), temperature source Oral, resp. rate 18, height 5\' 5"  (1.651 m), weight 79.4 kg (175 lb), last menstrual period 01/04/2016, unknown if currently breastfeeding.  Physical Exam:  General: alert Lochia: appropriate Uterine Fundus: firm and NT at U DVT Evaluation: No evidence of DVT seen on physical exam.   Recent Labs  10/18/16 0730  HGB 11.1*  HCT 31.8*    Assessment/Plan: Plan for discharge tomorrow   LOS: 1 day   Allie BossierMyra C Jaquille Kau 10/19/2016, 6:33 AM
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
## 2016-10-19 NOTE — Progress Notes (Signed)
Patient unable to read menu and states she need assistance ordering meals.

## 2016-10-20 MED ORDER — IBUPROFEN 600 MG PO TABS: 600.0000 mg | ORAL_TABLET | Freq: Four times a day (QID) | ORAL | 0 refills | Status: DC

## 2016-10-20 NOTE — Lactation Note (Signed)
This note was copied from a baby's chart. Lactation Consultation Note Patient Name: Girl Jacquenette ShoneLatoya Cull ZOXWR'UToday's Date: 10/20/2016 Reason for consult: Initial assessment   Mother formula feeding is interested in starting to pump. Provided her with hand pumps and demonstrated use. Recommend if she decides she wants to pump and bottle feed she would pump q3 hours. Mother seems unsure if she wants to or not. Offered to help her with breastfeeding but she declined assistance as this time. Suggest she call if she would like help later today. Mom encouraged to feed baby 8-12 times/24 hours and with feeding cues.  Reviewed engorgement care and monitoring voids/stools. Discussed applying cabbage leaves is she decides not to breastfeed.   Maternal Data Does the patient have breastfeeding experience prior to this delivery?: No  Feeding Feeding Type: Bottle Fed - Formula  LATCH Score/Interventions                      Lactation Tools Discussed/Used     Consult Status Consult Status: Complete    Hardie PulleyBerkelhammer, Ruth Boschen 10/20/2016, 11:03 AM

## 2016-10-20 NOTE — Discharge Instructions (Signed)

## 2016-10-20 NOTE — Discharge Summary (Signed)
OB Discharge Summary     Patient Name: Sarah MaizeLatoya M Chien DOB: 1986-07-14 MRN: [id number]  Date of admission: 10/18/2016 Delivering MD:     Date of discharge: 10/20/2016  Admitting diagnosis: 5667w1d, Induction Intrauterine pregnancy: 667w1d     Secondary diagnosis:  Active Problems:   Post term pregnancy at [redacted] weeks gestation  Additional problems: None     Discharge diagnosis: Term Pregnancy Delivered                                                                                                Post partum procedures:None  Augmentation: AROM and Pitocin  Complications: None  Hospital course:  Induction of Labor With Vaginal Delivery     31 y.o. yo [id number] at 5067w1d was admitted for postdates IOL on 10/18/2016. Patient had an uncomplicated labor course as follows:  Membrane Rupture Time/Date: 5:59 PM ,10/18/2016   Intrapartum Procedures: Episiotomy: None [1]                                         Lacerations:  None [1]  Patient had a delivery of a Viable infant. 10/18/2016  Information for the patient's newborn:  Sarah Foster, Girl Brelynn [[id number]][[id number]]       Pateint had an uncomplicated postpartum course.  She is ambulating, tolerating a regular diet, passing flatus, and urinating well. Patient is discharged home in stable condition on 10/20/16.   Physical exam  Vitals:   10/19/16 0041 10/19/16 0441 10/19/16 1847 10/20/16 0521  BP: 123/74 116/76 (!) 108/54 108/64  Pulse: 67 78 79 69  Resp: 16 18 18 16   Temp: 97.8 F (36.6 C) 97.7 F (36.5 C) 98 F (36.7 C) 98.4 F (36.9 C)  TempSrc: Oral Oral Oral Oral  Weight:      Height:       General: alert, cooperative and no distress Lochia: appropriate Uterine Fundus: firm Incision: Healing well with no significant drainage DVT Evaluation: No evidence of DVT seen on physical exam. Labs: Lab Results  Component Value Date   WBC 7.9 10/18/2016   HGB 11.1 (L) 10/18/2016   HCT 31.8 (L) 10/18/2016   MCV 90.6 10/18/2016   PLT 248  10/18/2016   CMP Latest Ref Rng & Units 01/03/2016  Glucose 65 - 99 mg/dL 96  BUN 6 - 20 mg/dL 13  Creatinine 2.530.44 - 6.641.00 mg/dL 4.030.62  Sodium 474135 - 259145 mmol/L 138  Potassium 3.5 - 5.1 mmol/L 4.0  Chloride 101 - 111 mmol/L 106  CO2 22 - 32 mmol/L 22  Calcium 8.9 - 10.3 mg/dL 9.3  Total Protein 6.0 - 8.3 g/dL -  Total Bilirubin 0.3 - 1.2 mg/dL -  Alkaline Phos 39 - 563117 U/L -  AST 0 - 37 U/L -  ALT 0 - 35 U/L -    Discharge instruction: per After Visit Summary and "Baby and Me Booklet".  After visit meds:  Allergies as of 10/20/2016   No Known Allergies     Medication List  STOP taking these medications   calcium carbonate 500 MG chewable tablet Commonly known as:  TUMS   prenatal multivitamin Tabs tablet   ranitidine 150 MG tablet Commonly known as:  ZANTAC     TAKE these medications   ibuprofen 600 MG tablet Commonly known as:  ADVIL,MOTRIN Take 1 tablet (600 mg total) by mouth every 6 (six) hours.       Diet: routine diet  Activity: Advance as tolerated. Pelvic rest for 6 weeks.   Outpatient follow up:6 weeks Follow up Appt:No future appointments. Follow up Visit:No Follow-up on file.  Postpartum contraception: Nexplanon  Newborn Data: Live born female  Birth Weight: 5 lb 14.2 oz (2671 g) APGAR: 8, 9  Baby Feeding: Bottle and Breast Disposition:home with mother   10/20/2016 Wendee Beavers, DO, PGY-1  CNM attestation I have seen and examined this patient and agree with above documentation in the resident's note.   Sarah Foster is a 31 y.o. Z6X0960 s/p SVD.   Pain is well controlled.  Plan for birth control is Nexplanon.  Method of Feeding: both  PE:  BP 108/64 (BP Location: Left Arm)   Pulse 69   Temp 98.4 F (36.9 C) (Oral)   Resp 16   Ht 5\' 5"  (1.651 m)   Wt 79.4 kg (175 lb)   LMP 01/04/2016   Breastfeeding? Unknown   BMI 29.12 kg/m  Fundus firm   Recent Labs  10/18/16 0730  HGB 11.1*  HCT 31.8*     Plan: discharge  today - postpartum care discussed - f/u clinic in 4-6 weeks for postpartum visit   Cam Hai, CNM 8:59 AM  10/20/2016

## 2016-10-20 NOTE — Progress Notes (Signed)
CSW made CPS report with [place] [employer] ([place]).  CSW report was accepted and CPS will follow-up with family after hospital d/c.  Pinki Rottman Boyd-Gilyard, MSW, LCSW Clinical Social Work [phone] 

## 2016-10-22 ENCOUNTER — Encounter (HOSPITAL_COMMUNITY): Payer: Self-pay

## 2016-12-26 ENCOUNTER — Ambulatory Visit (INDEPENDENT_AMBULATORY_CARE_PROVIDER_SITE_OTHER): Payer: Medicaid Other | Admitting: Family Medicine

## 2016-12-26 ENCOUNTER — Other Ambulatory Visit (HOSPITAL_COMMUNITY)
Admission: RE | Admit: 2016-12-26 | Discharge: 2016-12-26 | Disposition: A | Discharge status: Home / Self Care | Payer: Medicaid Other | Source: Ambulatory Visit | Attending: Family Medicine | Admitting: Family Medicine

## 2016-12-26 ENCOUNTER — Encounter: Payer: Self-pay | Admitting: Family Medicine

## 2016-12-26 VITALS — BP 114/74 | HR 82 | Wt 153.0 lb

## 2016-12-26 DIAGNOSIS — D060 Carcinoma in situ of endocervix: Secondary | ICD-10-CM | POA: Insufficient documentation

## 2016-12-26 DIAGNOSIS — D069 Carcinoma in situ of cervix, unspecified: Secondary | ICD-10-CM | POA: Diagnosis not present

## 2016-12-26 LAB — POCT PREGNANCY, URINE: PREG TEST UR: NEGATIVE

## 2016-12-26 NOTE — Progress Notes (Signed)
   GYNECOLOGY OFFICE PROCEDURE NOTE  Sarah Foster is a 31 y.o. Z6X0960 here for LEEP. No GYN concerns. Pap smear and colposcopy reviewed.    Pap HGSIL Colpo Biopsy CINII/III ECC benign  Risks, benefits, alternatives, and limitations of procedure explained to patient, including pain, bleeding, infection, failure to remove abnormal tissue and failure to cure dysplasia, need for repeat procedures, damage to pelvic organs, cervical incompetence.  Role of HPV,cervical dysplasia and need for close followup was empasized. Informed written consent was obtained. All questions were answered. Time out performed. Urine pregnancy test was negative.  Procedure: The patient was placed in lithotomy position and the bivalved coated speculum was placed in the patient's vagina. A grounding pad placed on the patient. Acetic acid and colposcopy performed and aceto-white areas noted around the transformation zone.   Local anesthesia was administered via an intracervical block using 20cc of 2% Lidocaine with epinephrine. The suction was turned on and the Medium 1X Fisher Cone Biopsy Excisor on 45 Watts of blend current was used to excise the area of decreased uptake and excise the entire transformation zone. Excellent hemostasis was achieved using roller ball coagulation set at 50 Watts coagulation current. Monsel's solution was then applied and the speculum was removed from the vagina. Specimens were sent to pathology.  The patient tolerated the procedure well. Post-operative instructions given to patient, including instruction to seek medical attention for persistent bright red bleeding, fever, abdominal/pelvic pain, dysuria, nausea or vomiting. She was also told about the possibility of having copious yellow to black tinged discharge for weeks. She was counseled to avoid anything in the vagina (sex/douching/tampons) for 2-3 weeks.      Reva Bores 12/26/2016 11:01 AM

## 2016-12-26 NOTE — Patient Instructions (Signed)
Cervical Conization, Care After °This sheet gives you information about how to care for yourself after your procedure. Your doctor may also give you more specific instructions. If you have problems or questions, contact your doctor. °Follow these instructions at home: °Medicines °· Take over-the-counter and prescription medicines only as told by your doctor. °· Do not take aspirin until your doctor says it is okay. °· If you take pain medicine: °? You may have constipation. To help treat this, your doctor may tell you to: °§ Drink enough fluid to keep your pee (urine) clear or pale yellow. °§ Take medicines. °§ Eat foods that are high in fiber. These include fresh fruits and vegetables, whole grains, bran, and beans. °§ Limit foods that are high in fat and sugar. These include fried foods and sweet foods. °? Do not drive or use heavy machines. °General instructions °· You can eat your usual diet unless your doctor tells you not to do so. °· Take showers for the first week. Do not take baths, swim, or use hot tubs until your doctor says it is okay. °· Do not douche, use tampons, or have sex until your doctor says it is okay. °· For 7-14 days after your procedure, avoid: °? Being very active. °? Exercising. °? Heavy lifting. °· Keep all follow-up visits as told by your doctor. This is important. °Contact a doctor if: °· You have a rash. °· You are dizzy or lightheaded. °· You feel sick to your stomach (nauseous). °· You throw up (vomit). °· You have fluid from your vagina (vaginal discharge) that smells bad. °Get help right away if: °· There are blood clots coming from your vagina. °· You have more bleeding than you would have in a normal period. For example, you soak a pad in less than 1 hour. °· You have a fever. °· You have more and more cramps. °· You pass out (faint). °· You have pain when peeing. °· Your have a lot of pain. °· Your pain gets worse. °· Your pain does not get better when you take your  medicine. °· You have blood in your pee. °· You throw up (vomit). °Summary °· After your procedure, take over-the-counter and prescription medicines only as told by your doctor. °· Do not douche, use tampons, or have sex until your doctor says it is okay. °· For about 7-14 days after your procedure, try not to exercise or lift heavy objects. °· Get help right away if you have new symptoms, or if your symptoms become worse. °This information is not intended to replace advice given to you by your health care provider. Make sure you discuss any questions you have with your health care provider. °Document Released: 05/22/2008 Document Revised: 08/15/2016 Document Reviewed: 08/15/2016 °Elsevier Interactive Patient Education © 2017 [employer] ° °

## 2017-01-01 ENCOUNTER — Telehealth: Payer: Self-pay | Admitting: *Deleted

## 2017-01-01 NOTE — Telephone Encounter (Signed)
Attempted to call patient with results of leep procedure. Phone rings once and then has a busy signal.

## 2017-01-01 NOTE — Telephone Encounter (Signed)
-----   Message from Reva Boresanya S Pratt, MD sent at 12/28/2016  8:21 AM EDT ----- Her leep shows negative margins--repeat pap with HPV in 1 year.

## 2017-01-14 ENCOUNTER — Encounter: Payer: Self-pay | Admitting: General Practice

## 2017-01-14 NOTE — Telephone Encounter (Signed)
Will send letter.

## 2017-04-06 IMAGING — US US OB TRANSVAGINAL
1 series · 14 of 28 positions shown · non-contrast
Comparison: None.

CLINICAL DATA: Vaginal bleeding. Approximately 6 weeks pregnant by
history. Unknown last menstrual period. Quantitative beta HCG 26.2.

EXAM:
OBSTETRIC <14 WK US AND TRANSVAGINAL OB US
TECHNIQUE: Both transabdominal and transvaginal ultrasound examinations were
performed for complete evaluation of the gestation as well as the
maternal uterus, adnexal regions, and pelvic cul-de-sac.
Transvaginal technique was performed to assess early pregnancy.

[Series 1: us ob transvaginal · 0.23mm/px · 14 of 67 slices shown]
[im 3/67]
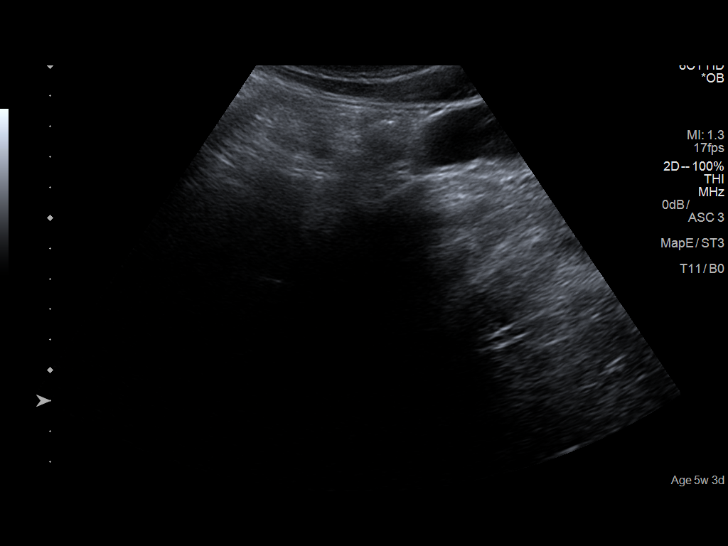
[im 8/67]
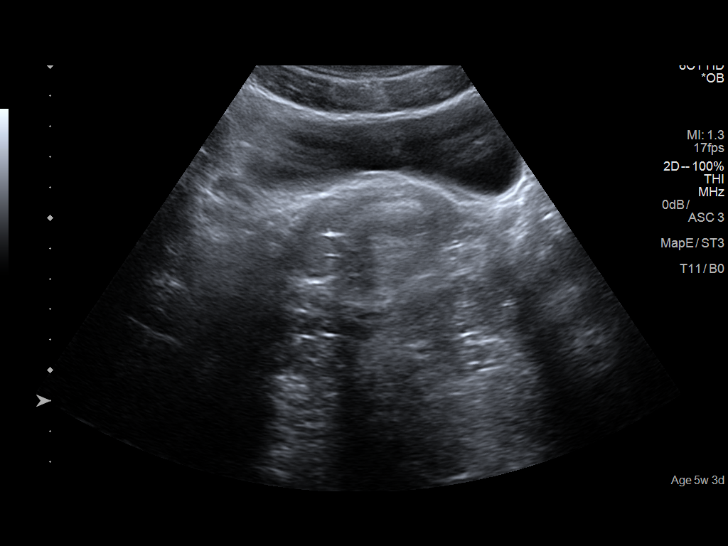
[im 13/67]
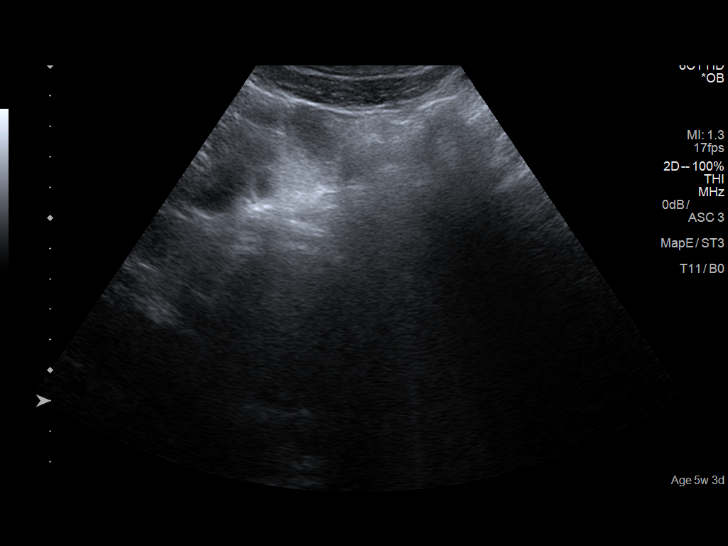
[im 18/67]
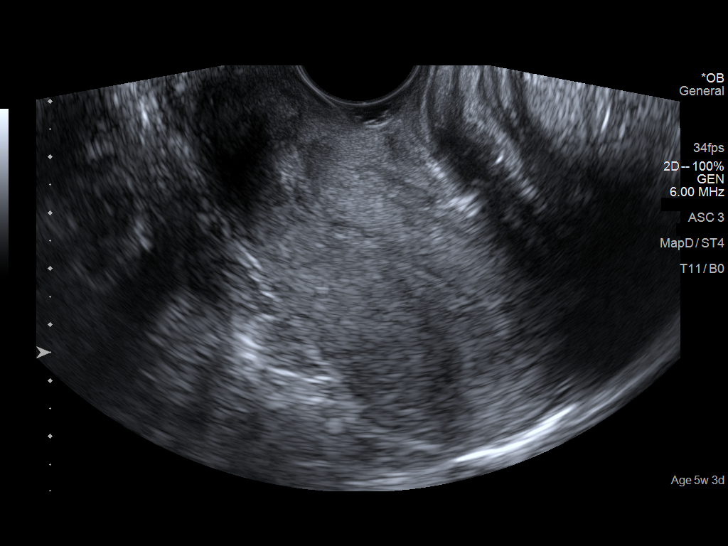
[im 23/67]
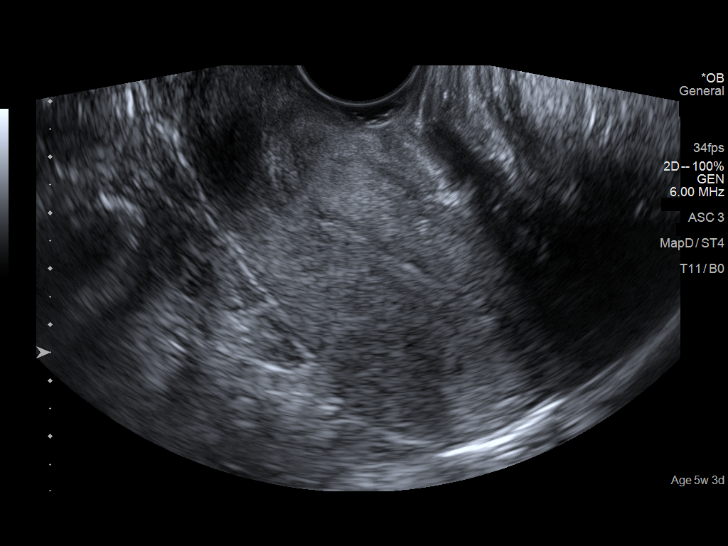
[im 27/67]
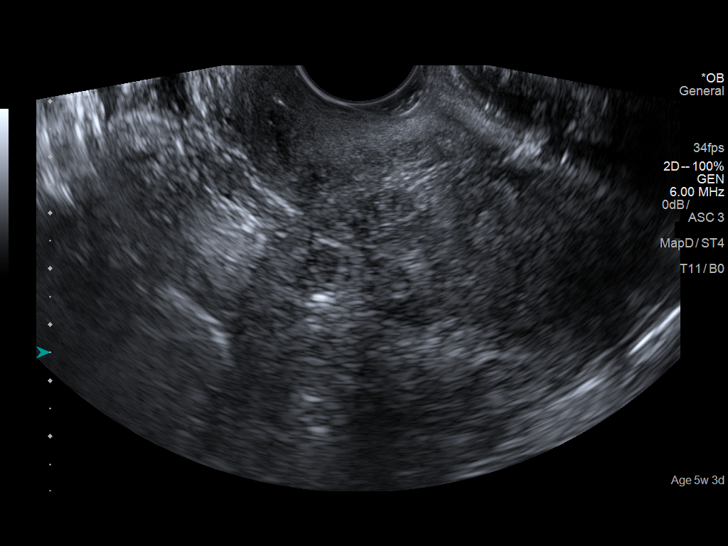
[im 32/67]
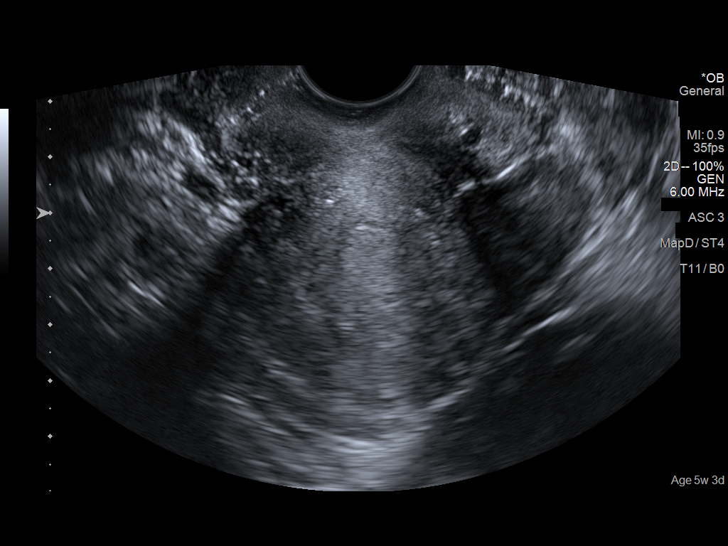
[im 37/67]
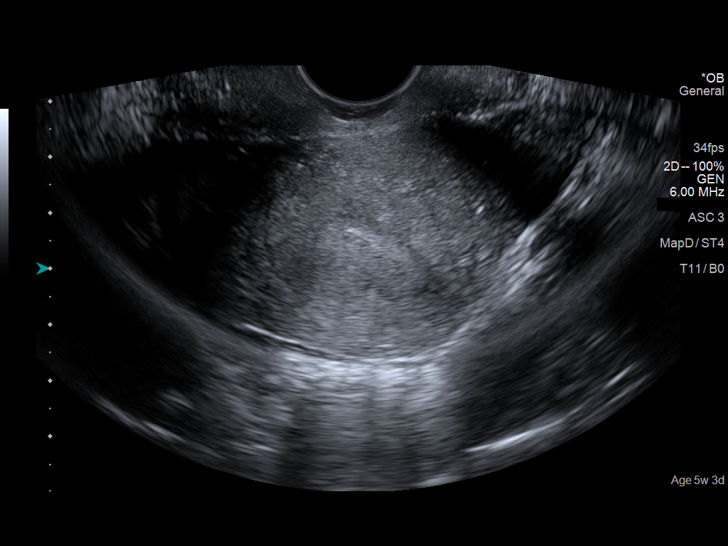
[im 42/67]
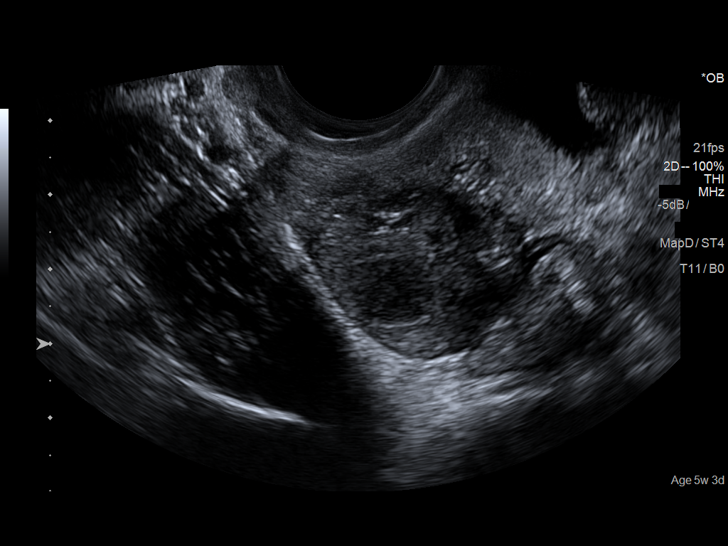
[im 47/67]
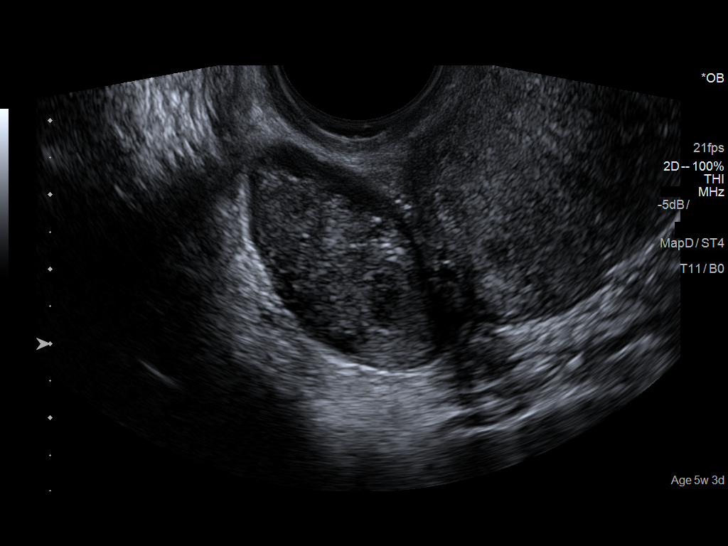
[im 52/67]
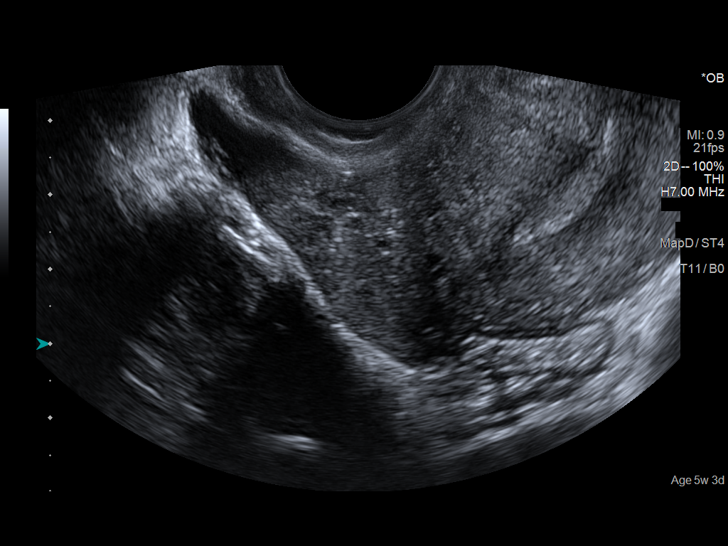
[im 57/67]
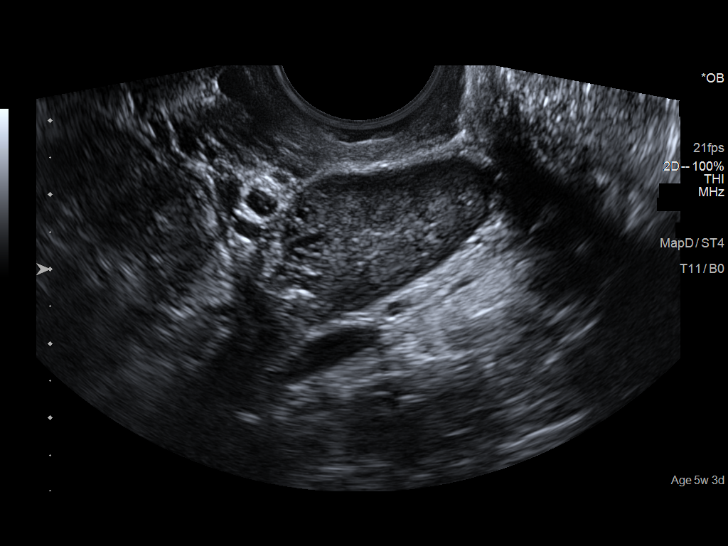
[im 62/67]
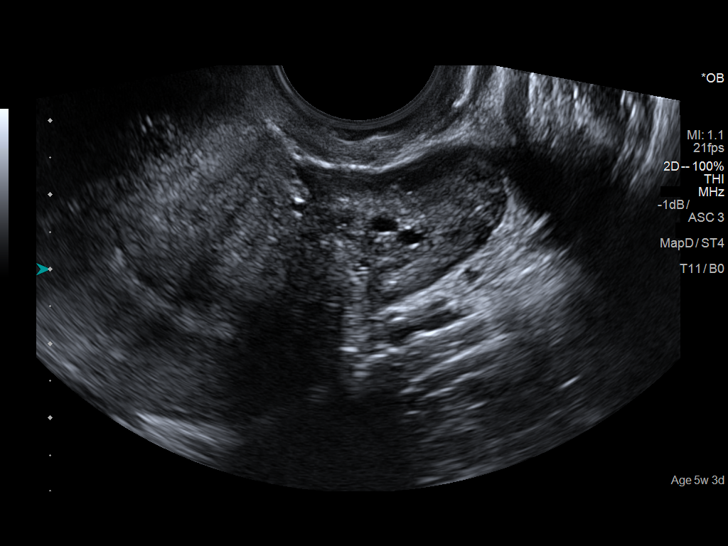
[im 67/67]
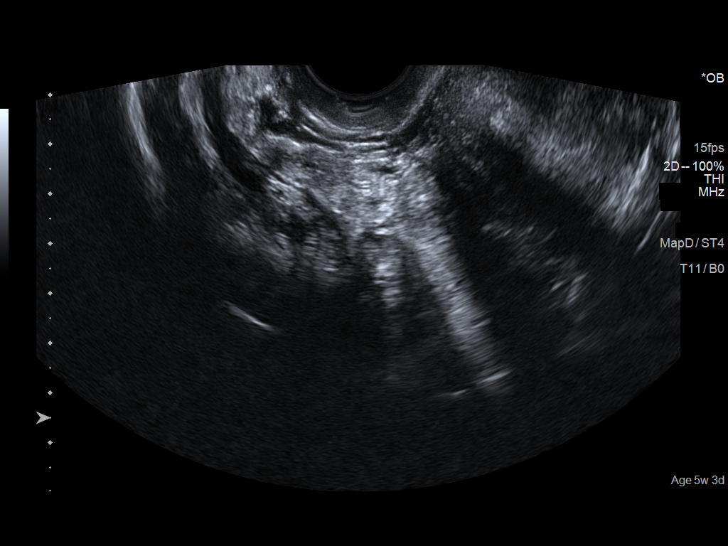

[14 of 28 positions shown; findings below may reference images not displayed]

FINDINGS: Intrauterine gestational sac: Not visualized

Yolk sac:  Not visualized

Embryo:  Not visualized

Maternal uterus/adnexae: Right ovarian corpus luteum. Normal
appearing left ovary. No free peritoneal fluid.
IMPRESSION: No intrauterine gestation or visible ectopic pregnancy. Correlation
with serial quantitative beta HCG determinations is recommended.

## 2017-08-27 NOTE — L&D Delivery Note (Signed)
Patient: Sarah Foster MRN: [id number]  GBS status: positive, IAP given ampicillin with adequate treatment prior to ROM.  Patient is a 32 y.o. now [id number] s/p NSVD at 10191w5d, who was admitted for SOL. AROM 0h 48m prior to delivery with light meconium fluid.  Head delivered ROA. No nuchal cord present. Shoulder and body delivered in usual fashion. Vigorous infant with spontaneous cry, placed on mother's abdomen, dried and bulb suctioned. Cord clamped x 2 after 1-minute delay, and cut by delivering clinician. Cord blood drawn. Placenta delivered spontaneously with gentle cord traction. Fundus firm with massage and Pitocin. Perineum inspected and found to have 1st degree periurethral laceration, which was repaired with one figure 8 suture with good hemostasis achieved.  Delivery Note At 2:16 PM a viable female was delivered via Vaginal, Spontaneous (Presentation:vertex ; ROA ).  APGAR:9 ,9 ; weight: pending .   Placenta status: delivered easily, intact.  Cord:  3 vessel with the following complications: none .  Cord pH: pending  Anesthesia:  Epidural, and local lidocaine  Episiotomy: None Lacerations: Periurethral Suture Repair: 4.0 vicryl Est. Blood Loss (mL): 50  Mom to postpartum.  Baby to Couplet care / Skin to Skin.  [name] 08/06/2018, 2:43 PM

## 2017-10-13 ENCOUNTER — Encounter (HOSPITAL_COMMUNITY): Payer: Self-pay

## 2017-10-13 ENCOUNTER — Emergency Department (HOSPITAL_COMMUNITY)
Admission: EM | Admit: 2017-10-13 | Discharge: 2017-10-13 | Disposition: A | Discharge status: Home / Self Care | Payer: Medicaid Other | Attending: Emergency Medicine | Admitting: Emergency Medicine

## 2017-10-13 DIAGNOSIS — J3489 Other specified disorders of nose and nasal sinuses: Secondary | ICD-10-CM | POA: Diagnosis not present

## 2017-10-13 DIAGNOSIS — Z87891 Personal history of nicotine dependence: Secondary | ICD-10-CM | POA: Diagnosis not present

## 2017-10-13 DIAGNOSIS — N6312 Unspecified lump in the right breast, upper inner quadrant: Secondary | ICD-10-CM | POA: Insufficient documentation

## 2017-10-13 DIAGNOSIS — N63 Unspecified lump in unspecified breast: Secondary | ICD-10-CM

## 2017-10-13 DIAGNOSIS — R070 Pain in throat: Secondary | ICD-10-CM | POA: Insufficient documentation

## 2017-10-13 DIAGNOSIS — J029 Acute pharyngitis, unspecified: Secondary | ICD-10-CM

## 2017-10-13 DIAGNOSIS — N6311 Unspecified lump in the right breast, upper outer quadrant: Secondary | ICD-10-CM | POA: Insufficient documentation

## 2017-10-13 LAB — RAPID STREP SCREEN (MED CTR MEBANE ONLY): Streptococcus, Group A Screen (Direct): NEGATIVE

## 2017-10-13 LAB — POC URINE PREG, ED: PREG TEST UR: NEGATIVE

## 2017-10-13 MED ORDER — KETOROLAC TROMETHAMINE 30 MG/ML IJ SOLN
30.0000 mg | Freq: Once | INTRAMUSCULAR | Status: AC
  Administered 2017-10-13: 30 mg via INTRAMUSCULAR
  Filled 2017-10-13: qty 1

## 2017-10-13 MED ORDER — DEXAMETHASONE SODIUM PHOSPHATE 10 MG/ML IJ SOLN
10.0000 mg | Freq: Once | INTRAMUSCULAR | Status: AC
  Administered 2017-10-13: 10 mg via INTRAMUSCULAR
  Filled 2017-10-13: qty 1

## 2017-10-13 MED ORDER — FLUTICASONE PROPIONATE 50 MCG/ACT NA SUSP: 1.0000 | Freq: Every day | NASAL | 2 refills | Status: AC

## 2017-10-13 MED ORDER — IBUPROFEN 600 MG PO TABS: 600.0000 mg | ORAL_TABLET | Freq: Four times a day (QID) | ORAL | 0 refills | Status: DC | PRN

## 2017-10-13 MED ORDER — PHENOL 1.4 % MT LIQD: 1.0000 | OROMUCOSAL | 0 refills | Status: AC | PRN

## 2017-10-13 NOTE — ED Notes (Signed)
Declined W/C at D/C and was escorted to lobby by RN. 

## 2017-10-13 NOTE — ED Provider Notes (Signed)
[facility] EMERGENCY DEPARTMENT Provider Note   CSN: [id number] Arrival date & time: 10/13/17  1102     History   Chief Complaint Chief Complaint  Patient presents with  . cough/sore throat    HPI Sarah Foster is a 32 y.o. female.  HPI   Patient is a 32 y.o. female history of anxiety, GERD presenting for sore throat.  Patient reports that she has had a progressive worsening sore throat for a week.  Patient which is also has some congestion and rhinorrhea.  Patient reports she felt feverish earlier in the week, how she did not take her temperature.  Patient denies obstruction, difficulty breathing, or difficulty swelling.  Patient reports she has avoided swallowing due to the pain throat and a scratchy sensation.  Patient reports she also feels a lump in her lower neck.  Patient denies any history of thyroid problems.  Patient reports she had one episode of palpitations, but does not expresses currently.  Patient reports multiple sick contacts, she works in a group home, and has multiple children that have been sick.  No remedies attempted for symptoms thus far.  Patient also noting a lesion on the right breast that she felt as a lump. Patient though she felt the area dimpling. Lesion noted 2 weeks ago. No nipple drainage.   Past Medical History:  Diagnosis Date  . Anxiety   . GERD (gastroesophageal reflux disease)    TUMS  . Vaginal Pap smear, abnormal 2017   pt stated needs colposcopy after delivery    Patient Active Problem List   Diagnosis Date Noted  . Acid reflux 08/10/2016  . Cervical intraepithelial neoplasia grade III with severe dysplasia 08/10/2016  . Abnormal maternal serum screening test 05/29/2016    Past Surgical History:  Procedure Laterality Date  . NO PAST SURGERIES      OB History    Gravida Para Term Preterm AB Living   3 1 1  0 2 1   SAB TAB Ectopic Multiple Live Births   2 0 0 0 1       Home Medications    Prior to  Admission medications   Medication Sig Start Date End Date Taking? Authorizing Provider  ibuprofen (ADVIL,MOTRIN) 600 MG tablet Take 1 tablet (600 mg total) by mouth every 6 (six) hours. 10/20/16   Wendee Beavers, DO  Norgestimate-Ethinyl Estradiol Triphasic (TRI-SPRINTEC) 0.18/0.215/0.25 MG-35 MCG tablet Take 1 tablet by mouth daily.    [provider]    Family History No family history on file.  Social History Social History   Tobacco Use  . Smoking status: Former Smoker    Packs/day: 0.50    Types: Cigarettes  . Smokeless tobacco: Former Engineer, water Use Topics  . Alcohol use: Yes    Comment: occas  . Drug use: Yes    Types: Marijuana    Comment: last week     Allergies   Patient has no known allergies.   Review of Systems Review of Systems  Constitutional: Negative for chills and fever.  HENT: Positive for congestion, rhinorrhea and sore throat. Negative for trouble swallowing and voice change.   Respiratory: Negative for cough and stridor.   Gastrointestinal: Negative for nausea and vomiting.  Musculoskeletal: Negative for myalgias.     Physical Exam Updated Vital Signs BP 109/61   Pulse 73   Temp 99 F (37.2 C) (Oral)   Resp 18   SpO2 99%   Physical Exam  Constitutional:  She appears well-developed and well-nourished. No distress.  Sitting comfortably in bed.  HENT:  Head: Normocephalic and atraumatic.  Normal phonation. No muffled voice sounds. Patient swallows secretions without difficulty. Dentition . No lesions of tongue or buccal mucosa. Uvula midline. No asymmetric swelling of the posterior pharynx.+ Erythema of posterior pharynx. No tonsillar exuduate. No lingual swelling. No induration inferior to tongue. No submandibular tenderness, swelling, or induration.  Tissues of the neck supple. No cervical lymphadenopathy. Right TM without erythema or effusion; left TM with serous effusion but no loss of bony landmarks.  Eyes: Conjunctivae  are normal. Right eye exhibits no discharge. Left eye exhibits no discharge.  EOMs normal to gross examination.  Neck: Normal range of motion.  Cardiovascular: Normal rate and regular rhythm.  Intact, 2+ radial pulse.  Pulmonary/Chest: Effort normal. No respiratory distress.  Normal respiratory effort. Patient converses comfortably. No audible wheeze or stridor.  Abdominal: She exhibits no distension.  Musculoskeletal: Normal range of motion.  Neurological: She is alert.  Cranial nerves intact to gross observation. Patient moves extremities without difficulty.  Skin: Skin is warm and dry. She is not diaphoretic.  Breast exam performed with nurse chaperon present. Right breast exhibits well circumscribed, mobile nodule in the 12:00 position. No obvious dimpling over site through range of motion. No axillary lymphadenopathy.  Psychiatric: She has a normal mood and affect. Her behavior is normal. Judgment and thought content normal.  Nursing note and vitals reviewed.    ED Treatments / Results  Labs (all labs ordered are listed, but only abnormal results are displayed) Labs Reviewed  RAPID STREP SCREEN (NOT AT [facility])  POC URINE PREG, ED    EKG  EKG Interpretation None       Radiology No results found.  Procedures Procedures (including critical care time)  Medications Ordered in ED Medications - No data to display   Initial Impression / Assessment and Plan / ED Course  I have reviewed the triage vital signs and the nursing notes.  Pertinent labs & imaging results that were available during my care of the patient were reviewed by me and considered in my medical decision making (see chart for details).     Sarah Foster is a 32 y.o. female who presents to ED for sore throat. Patient nontoxic appearing and in no acute distress. Rapid strep negative. Suspect viral etiology. Presentation not concerning for PTA or infection spread to soft tissue. No trismus or uvula  deviation. Patient with normal phonation. Exam demonstrates soft neck tissue, no swelling or induration inferior to the tongue or in the submandibular space  Treated in the ED with steroids, NSAIDs. Rx for Flonase, chloraseptic spray, ibuprofen. Patient able to drink water in ED without difficulty with intact air way. Specific return precautions discussed for change in voice, inability to tolerate secretions, difficulty breathing or swallowing, or increased nausea or vomiting. Discussed importance of hydration. Recommended PCP follow up and provided referral suggestions. All questions answered.  Patient also noting breast lump. No concerning features identified at this time. Provided pt primary care referrals for [facility], as well [facility].   Final Clinical Impressions(s) / ED Diagnoses   Final diagnoses:  Sore throat  Breast lump    ED Discharge Orders    None       Delia ChimesMurray, Shaliah Wann B, PA-C 10/13/17 2136    Wynetta FinesMessick, Peter C, MD 10/13/17 2138

## 2017-10-13 NOTE — ED Triage Notes (Signed)
Patient complains of 1 week of cough, congestion with sore throat and ear pain, NAD

## 2017-10-13 NOTE — Discharge Instructions (Addendum)
Please read and follow all provided instructions.  Your diagnoses today include:  1. Sore throat   2. Breast lump     Tests performed today include: Strep test: was negative for strep throat Strep culture: you will be notified if this comes back positive Vital signs. See below for your results today.   Medications prescribed:   You are prescribed ibuprofen, a non-steroidal anti-inflammatory agent (NSAID) for pain. You may take 600mg  every 6 hours as needed for pain. If still requiring this medication around the clock for acute pain after 10 days, please see your primary healthcare provider.  Women who are pregnant, breastfeeding, or planning on becoming pregnant should not take non-steroidal anti-inflammatories.  You may combine this medication with Tylenol, 650 mg every 6 hours, so you are receiving something for pain every 3 hours.  This is not a long-term medication unless under the care and direction of your primary provider. Taking this medication long-term and not under the supervision of a healthcare provider could increase the risk of stomach ulcers, kidney problems, and cardiovascular problems such as high blood pressure.   Please take Benadryl 25-50 mg as needed.  This will help reduce the inflammation of uvula, as well as provide some assistance with sleep.   Home care instructions:  Please read the educational materials provided and follow any instructions contained in this packet.  Follow-up instructions: Please follow-up with your primary care provider as needed for further evaluation of your symptoms.  Return instructions:  Please return to the Emergency Department if you experience worsening symptoms.  Return if you have worsening problems swallowing, your neck becomes swollen, you cannot swallow your saliva or your voice becomes muffled.  Return with high persistent fever, persistent vomiting, or if you have trouble breathing.  Please return if you have any other  emergent concerns.  Additional Information:  Your vital signs today were: BP 109/61    Pulse 73    Temp 99 F (37.2 C) (Oral)    Resp 18    SpO2 99%  If your blood pressure (BP) was elevated above 135/85 this visit, please have this repeated by your doctor within one month. --------------

## 2017-10-14 LAB — GC/CHLAMYDIA PROBE AMP (~~LOC~~) NOT AT ARMC
Chlamydia: NEGATIVE
Neisseria Gonorrhea: NEGATIVE

## 2017-10-15 LAB — CULTURE, GROUP A STREP (THRC)

## 2018-02-06 LAB — OB RESULTS CONSOLE RPR: RPR: NONREACTIVE

## 2018-02-06 LAB — OB RESULTS CONSOLE GC/CHLAMYDIA
CHLAMYDIA, DNA PROBE: NEGATIVE
GC PROBE AMP, GENITAL: NEGATIVE

## 2018-02-06 LAB — OB RESULTS CONSOLE HIV ANTIBODY (ROUTINE TESTING): HIV: NONREACTIVE

## 2018-02-06 LAB — OB RESULTS CONSOLE HEPATITIS B SURFACE ANTIGEN: Hepatitis B Surface Ag: NEGATIVE

## 2018-02-06 LAB — OB RESULTS CONSOLE RUBELLA ANTIBODY, IGM: Rubella: IMMUNE

## 2018-08-05 ENCOUNTER — Other Ambulatory Visit: Payer: Self-pay

## 2018-08-05 ENCOUNTER — Encounter (HOSPITAL_COMMUNITY): Payer: Self-pay | Admitting: *Deleted

## 2018-08-05 ENCOUNTER — Inpatient Hospital Stay (HOSPITAL_COMMUNITY)
Admission: AD | Admit: 2018-08-05 | Discharge: 2018-08-05 | Disposition: A | Discharge status: Home / Self Care | Payer: Medicaid Other | Source: Home / Self Care | Attending: Obstetrics & Gynecology | Admitting: Obstetrics & Gynecology

## 2018-08-05 DIAGNOSIS — O471 False labor at or after 37 completed weeks of gestation: Secondary | ICD-10-CM | POA: Insufficient documentation

## 2018-08-05 DIAGNOSIS — Z3689 Encounter for other specified antenatal screening: Secondary | ICD-10-CM

## 2018-08-05 DIAGNOSIS — Z3A39 39 weeks gestation of pregnancy: Secondary | ICD-10-CM | POA: Insufficient documentation

## 2018-08-05 DIAGNOSIS — B379 Candidiasis, unspecified: Secondary | ICD-10-CM

## 2018-08-05 LAB — WET PREP, GENITAL
CLUE CELLS WET PREP: NONE SEEN
Sperm: NONE SEEN
TRICH WET PREP: NONE SEEN

## 2018-08-05 MED ORDER — FLUCONAZOLE 150 MG PO TABS: 150.0000 mg | ORAL_TABLET | Freq: Once | ORAL | 0 refills | Status: AC

## 2018-08-05 NOTE — MAU Provider Note (Signed)
First Provider Initiated Contact with Patient 08/05/18 1615     S: [name] is a 32 y.o. [phone] at 1144w4d  who presents to MAU today complaining contractions q 15 minutes since 8am this morning. She denies vaginal bleeding. She denies LOF. She reports normal fetal movement.    Patient endorses secondary complaint of internal vaginal irritation. She states she recently switched from [facility] Sensitive skin to Dial soap but has just switched back to [place]. She is also using "some kind of powder" externally to "keep me dry".  O: BP 108/66 (BP Location: Left Arm)   Pulse 91   Temp (!) 97.5 F (36.4 C) (Oral)   Resp 20   SpO2 100%  GENERAL: Well-developed, well-nourished female in no acute distress.  HEAD: Normocephalic, atraumatic.  CHEST: Normal effort of breathing, regular heart rate ABDOMEN: Soft, nontender, gravid  Cervical exam:  Dilation: Closed Effacement (%): Thick Exam by:: Knox SalivaS. Weinhold, CNM   Fetal Monitoring: Baseline: 135 Variability: moderate Accelerations: positive Decelerations: None Contractions: Irregular, q 1-9 minutes with uterine irritability, palpate mild, not felt by patient  A: SIUP at 4844w4d  False labor cervix remains closed, reactive tracing Wet prep shows yeast infection, rx Diflucan to pharmacy  P: Discharge home in stable condition   F/U: Health department  Calvert CantorWeinhold, Samantha C, [place] 08/05/2018 5:11 PM

## 2018-08-05 NOTE — MAU Note (Addendum)
Presents with ctxs, reports was contracting regularly this morning, but ctxs are now irregular and have "slowed down.  Denies VB or LOF.  Reports +FM. Also c/o vaginal itching x3 days

## 2018-08-05 NOTE — Discharge Instructions (Signed)
Vaginal Delivery Vaginal delivery means that you will give birth by pushing your baby out of your birth canal (vagina). A team of health care providers will help you before, during, and after vaginal delivery. Birth experiences are unique for every woman and every pregnancy, and birth experiences vary depending on where you choose to give birth. What should I do to prepare for my baby's birth? Before your baby is born, it is important to talk with your health care provider about:  Your labor and delivery preferences. These may include: ? Medicines that you may be given. ? How you will manage your pain. This might include non-medical pain relief techniques or injectable pain relief such as epidural analgesia. ? How you and your baby will be monitored during labor and delivery. ? Who may be in the labor and delivery room with you. ? Your feelings about surgical delivery of your baby (cesarean delivery, or C-section) if this becomes necessary. ? Your feelings about receiving donated blood through an IV tube (blood transfusion) if this becomes necessary.  Whether you are able: ? To take pictures or videos of the birth. ? To eat during labor and delivery. ? To move around, walk, or change positions during labor and delivery.  What to expect after your baby is born, such as: ? Whether delayed umbilical cord clamping and cutting is offered. ? Who will care for your baby right after birth. ? Medicines or tests that may be recommended for your baby. ? Whether breastfeeding is supported in your hospital or birth center. ? How long you will be in the hospital or birth center.  How any medical conditions you have may affect your baby or your labor and delivery experience.  To prepare for your baby's birth, you should also:  Attend all of your health care visits before delivery (prenatal visits) as recommended by your health care provider. This is important.  Prepare your home for your baby's  arrival. Make sure that you have: ? Diapers. ? Baby clothing. ? Feeding equipment. ? Safe sleeping arrangements for you and your baby.  Install a car seat in your vehicle. Have your car seat checked by a certified car seat installer to make sure that it is installed safely.  Think about who will help you with your new baby at home for at least the first several weeks after delivery.  What can I expect when I arrive at the birth center or hospital? Once you are in labor and have been admitted into the hospital or birth center, your health care provider may:  Review your pregnancy history and any concerns you have.  Insert an IV tube into one of your veins. This is used to give you fluids and medicines.  Check your blood pressure, pulse, temperature, and heart rate (vital signs).  Check whether your bag of water (amniotic sac) has broken (ruptured).  Talk with you about your birth plan and discuss pain control options.  Monitoring Your health care provider may monitor your contractions (uterine monitoring) and your baby's heart rate (fetal monitoring). You may need to be monitored:  Often, but not continuously (intermittently).  All the time or for long periods at a time (continuously). Continuous monitoring may be needed if: ? You are taking certain medicines, such as medicine to relieve pain or make your contractions stronger. ? You have pregnancy or labor complications.  Monitoring may be done by:  Placing a special stethoscope or a handheld monitoring device on your abdomen to   check your baby's heartbeat, and feeling your abdomen for contractions. This method of monitoring does not continuously record your baby's heartbeat or your contractions.  Placing monitors on your abdomen (external monitors) to record your baby's heartbeat and the frequency and length of contractions. You may not have to wear external monitors all the time.  Placing monitors inside of your uterus  (internal monitors) to record your baby's heartbeat and the frequency, length, and strength of your contractions. ? Your health care provider may use internal monitors if he or she needs more information about the strength of your contractions or your baby's heart rate. ? Internal monitors are put in place by passing a thin, flexible wire through your vagina and into your uterus. Depending on the type of monitor, it may remain in your uterus or on your baby's head until birth. ? Your health care provider will discuss the benefits and risks of internal monitoring with you and will ask for your permission before inserting the monitors.  Telemetry. This is a type of continuous monitoring that can be done with external or internal monitors. Instead of having to stay in bed, you are able to move around during telemetry. Ask your health care provider if telemetry is an option for you.  Physical exam Your health care provider may perform a physical exam. This may include:  Checking whether your baby is positioned: ? With the head toward your vagina (head-down). This is most common. ? With the head toward the top of your uterus (head-up or breech). If your baby is in a breech position, your health care provider may try to turn your baby to a head-down position so you can deliver vaginally. If it does not seem that your baby can be born vaginally, your provider may recommend surgery to deliver your baby. In rare cases, you may be able to deliver vaginally if your baby is head-up (breech delivery). ? Lying sideways (transverse). Babies that are lying sideways cannot be delivered vaginally.  Checking your cervix to determine: ? Whether it is thinning out (effacing). ? Whether it is opening up (dilating). ? How low your baby has moved into your birth canal.  What are the three stages of labor and delivery?  Normal labor and delivery is divided into the following three stages: Stage 1  Stage 1 is the  longest stage of labor, and it can last for hours or days. Stage 1 includes: ? Early labor. This is when contractions may be irregular, or regular and mild. Generally, early labor contractions are more than 10 minutes apart. ? Active labor. This is when contractions get longer, more regular, more frequent, and more intense. ? The transition phase. This is when contractions happen very close together, are very intense, and may last longer than during any other part of labor.  Contractions generally feel mild, infrequent, and irregular at first. They get stronger, more frequent (about every 2-3 minutes), and more regular as you progress from early labor through active labor and transition.  Many women progress through stage 1 naturally, but you may need help to continue making progress. If this happens, your health care provider may talk with you about: ? Rupturing your amniotic sac if it has not ruptured yet. ? Giving you medicine to help make your contractions stronger and more frequent.  Stage 1 ends when your cervix is completely dilated to 4 inches (10 cm) and completely effaced. This happens at the end of the transition phase. Stage 2  Once   your cervix is completely effaced and dilated to 4 inches (10 cm), you may start to feel an urge to push. It is common for the body to naturally take a rest before feeling the urge to push, especially if you received an epidural or certain other pain medicines. This rest period may last for up to 1-2 hours, depending on your unique labor experience.  During stage 2, contractions are generally less painful, because pushing helps relieve contraction pain. Instead of contraction pain, you may feel stretching and burning pain, especially when the widest part of your baby's head passes through the vaginal opening (crowning).  Your health care provider will closely monitor your pushing progress and your baby's progress through the vagina during stage 2.  Your  health care provider may massage the area of skin between your vaginal opening and anus (perineum) or apply warm compresses to your perineum. This helps it stretch as the baby's head starts to crown, which can help prevent perineal tearing. ? In some cases, an incision may be made in your perineum (episiotomy) to allow the baby to pass through the vaginal opening. An episiotomy helps to make the opening of the vagina larger to allow more room for the baby to fit through.  It is very important to breathe and focus so your health care provider can control the delivery of your baby's head. Your health care provider may have you decrease the intensity of your pushing, to help prevent perineal tearing.  After delivery of your baby's head, the shoulders and the rest of the body generally deliver very quickly and without difficulty.  Once your baby is delivered, the umbilical cord may be cut right away, or this may be delayed for 1-2 minutes, depending on your baby's health. This may vary among health care providers, hospitals, and birth centers.  If you and your baby are healthy enough, your baby may be placed on your chest or abdomen to help maintain the baby's temperature and to help you bond with each other. Some mothers and babies start breastfeeding at this time. Your health care team will dry your baby and help keep your baby warm during this time.  Your baby may need immediate care if he or she: ? Showed signs of distress during labor. ? Has a medical condition. ? Was born too early (prematurely). ? Had a bowel movement before birth (meconium). ? Shows signs of difficulty transitioning from being inside the uterus to being outside of the uterus. If you are planning to breastfeed, your health care team will help you begin a feeding. Stage 3  The third stage of labor starts immediately after the birth of your baby and ends after you deliver the placenta. The placenta is an organ that develops  during pregnancy to provide oxygen and nutrients to your baby in the womb.  Delivering the placenta may require some pushing, and you may have mild contractions. Breastfeeding can stimulate contractions to help you deliver the placenta.  After the placenta is delivered, your uterus should tighten (contract) and become firm. This helps to stop bleeding in your uterus. To help your uterus contract and to control bleeding, your health care provider may: ? Give you medicine by injection, through an IV tube, by mouth, or through your rectum (rectally). ? Massage your abdomen or perform a vaginal exam to remove any blood clots that are left in your uterus. ? Empty your bladder by placing a thin, flexible tube (catheter) into your bladder. ? Encourage   you to breastfeed your baby. After labor is over, you and your baby will be monitored closely to ensure that you are both healthy until you are ready to go home. Your health care team will teach you how to care for yourself and your baby. This information is not intended to replace advice given to you by your health care provider. Make sure you discuss any questions you have with your health care provider. Document Released: 05/22/2008 Document Revised: 03/02/2016 Document Reviewed: 08/28/2015 Elsevier Interactive Patient Education  2018 [employer]  

## 2018-08-06 ENCOUNTER — Inpatient Hospital Stay (HOSPITAL_COMMUNITY): Payer: Medicaid Other | Admitting: Anesthesiology

## 2018-08-06 ENCOUNTER — Encounter (HOSPITAL_COMMUNITY): Payer: Self-pay

## 2018-08-06 ENCOUNTER — Encounter (HOSPITAL_COMMUNITY): Payer: Self-pay | Admitting: *Deleted

## 2018-08-06 ENCOUNTER — Inpatient Hospital Stay (HOSPITAL_COMMUNITY)
Admission: AD | Admit: 2018-08-06 | Discharge: 2018-08-08 | DRG: 807 | Disposition: A | Discharge status: Home / Self Care | Payer: Medicaid Other | Attending: Family Medicine | Admitting: Family Medicine

## 2018-08-06 ENCOUNTER — Inpatient Hospital Stay (HOSPITAL_COMMUNITY)
Admission: AD | Admit: 2018-08-06 | Discharge: 2018-08-06 | Disposition: A | Discharge status: Home / Self Care | Payer: Medicaid Other | Source: Ambulatory Visit | Attending: Family Medicine | Admitting: Family Medicine

## 2018-08-06 DIAGNOSIS — O9962 Diseases of the digestive system complicating childbirth: Secondary | ICD-10-CM | POA: Diagnosis present

## 2018-08-06 DIAGNOSIS — O471 False labor at or after 37 completed weeks of gestation: Secondary | ICD-10-CM

## 2018-08-06 DIAGNOSIS — Z87891 Personal history of nicotine dependence: Secondary | ICD-10-CM | POA: Diagnosis not present

## 2018-08-06 DIAGNOSIS — O99824 Streptococcus B carrier state complicating childbirth: Secondary | ICD-10-CM | POA: Diagnosis not present

## 2018-08-06 DIAGNOSIS — Z86001 Personal history of in-situ neoplasm of cervix uteri: Secondary | ICD-10-CM | POA: Diagnosis not present

## 2018-08-06 DIAGNOSIS — Z3A39 39 weeks gestation of pregnancy: Secondary | ICD-10-CM

## 2018-08-06 DIAGNOSIS — Z3483 Encounter for supervision of other normal pregnancy, third trimester: Secondary | ICD-10-CM | POA: Diagnosis present

## 2018-08-06 DIAGNOSIS — K219 Gastro-esophageal reflux disease without esophagitis: Secondary | ICD-10-CM | POA: Diagnosis present

## 2018-08-06 HISTORY — DX: Abnormal hematological finding on antenatal screening of mother: O28.0

## 2018-08-06 LAB — CBC
HCT: 38.4 % (ref 36.0–46.0)
Hemoglobin: 12.8 g/dL (ref 12.0–15.0)
MCH: 32.3 pg (ref 26.0–34.0)
MCHC: 33.3 g/dL (ref 30.0–36.0)
MCV: 97 fL (ref 80.0–100.0)
Platelets: 219 10*3/uL (ref 150–400)
RBC: 3.96 MIL/uL (ref 3.87–5.11)
RDW: 13.4 % (ref 11.5–15.5)
WBC: 12.2 10*3/uL — ABNORMAL HIGH (ref 4.0–10.5)
nRBC: 0 % (ref 0.0–0.2)

## 2018-08-06 LAB — TYPE AND SCREEN
ABO/RH(D): O POS
Antibody Screen: NEGATIVE

## 2018-08-06 LAB — AMNISURE RUPTURE OF MEMBRANE (ROM) NOT AT ARMC: Amnisure ROM: NEGATIVE

## 2018-08-06 LAB — POCT FERN TEST: POCT Fern Test: NEGATIVE

## 2018-08-06 MED ORDER — EPHEDRINE 5 MG/ML INJ
10.0000 mg | INTRAVENOUS | Status: DC | PRN
  Filled 2018-08-06: qty 2

## 2018-08-06 MED ORDER — FENTANYL 2.5 MCG/ML BUPIVACAINE 1/10 % EPIDURAL INFUSION (WH - ANES)
14.0000 mL/h | INTRAMUSCULAR | Status: DC | PRN
  Administered 2018-08-06: 14 mL/h via EPIDURAL
  Filled 2018-08-06: qty 100

## 2018-08-06 MED ORDER — SODIUM CHLORIDE 0.9 % IV SOLN
2.0000 g | Freq: Once | INTRAVENOUS | Status: AC
  Administered 2018-08-06: 2 g via INTRAVENOUS
  Filled 2018-08-06: qty 2000

## 2018-08-06 MED ORDER — PRENATAL MULTIVITAMIN CH
1.0000 | ORAL_TABLET | Freq: Every day | ORAL | Status: DC
  Administered 2018-08-07: 1 via ORAL
  Filled 2018-08-06 (×2): qty 1

## 2018-08-06 MED ORDER — SENNOSIDES-DOCUSATE SODIUM 8.6-50 MG PO TABS
2.0000 | ORAL_TABLET | ORAL | Status: DC
  Administered 2018-08-06 – 2018-08-07 (×2): 2 via ORAL
  Filled 2018-08-06 (×2): qty 2

## 2018-08-06 MED ORDER — PHENYLEPHRINE 40 MCG/ML (10ML) SYRINGE FOR IV PUSH (FOR BLOOD PRESSURE SUPPORT)
80.0000 ug | PREFILLED_SYRINGE | INTRAVENOUS | Status: DC | PRN
  Filled 2018-08-06 (×2): qty 10

## 2018-08-06 MED ORDER — LIDOCAINE HCL (PF) 1 % IJ SOLN
INTRAMUSCULAR | Status: AC
  Filled 2018-08-06: qty 30

## 2018-08-06 MED ORDER — OXYTOCIN 40 UNITS IN LACTATED RINGERS INFUSION - SIMPLE MED: 2.5000 [IU]/h | INTRAVENOUS | Status: DC

## 2018-08-06 MED ORDER — FENTANYL CITRATE (PF) 100 MCG/2ML IJ SOLN
100.0000 ug | INTRAMUSCULAR | Status: DC | PRN
  Administered 2018-08-06: 100 ug via INTRAVENOUS
  Filled 2018-08-06: qty 2

## 2018-08-06 MED ORDER — DIPHENHYDRAMINE HCL 50 MG/ML IJ SOLN: 12.5000 mg | INTRAMUSCULAR | Status: DC | PRN

## 2018-08-06 MED ORDER — ONDANSETRON HCL 4 MG PO TABS: 4.0000 mg | ORAL_TABLET | ORAL | Status: DC | PRN

## 2018-08-06 MED ORDER — TETANUS-DIPHTH-ACELL PERTUSSIS 5-2.5-18.5 LF-MCG/0.5 IM SUSP: 0.5000 mL | Freq: Once | INTRAMUSCULAR | Status: DC

## 2018-08-06 MED ORDER — SOD CITRATE-CITRIC ACID 500-334 MG/5ML PO SOLN: 30.0000 mL | ORAL | Status: DC | PRN

## 2018-08-06 MED ORDER — OXYTOCIN BOLUS FROM INFUSION
500.0000 mL | Freq: Once | INTRAVENOUS | Status: AC
  Administered 2018-08-06: 500 mL via INTRAVENOUS

## 2018-08-06 MED ORDER — ONDANSETRON HCL 4 MG/2ML IJ SOLN: 4.0000 mg | INTRAMUSCULAR | Status: DC | PRN

## 2018-08-06 MED ORDER — PHENYLEPHRINE 40 MCG/ML (10ML) SYRINGE FOR IV PUSH (FOR BLOOD PRESSURE SUPPORT)
80.0000 ug | PREFILLED_SYRINGE | INTRAVENOUS | Status: DC | PRN
  Filled 2018-08-06: qty 10

## 2018-08-06 MED ORDER — COCONUT OIL OIL
1.0000 "application " | TOPICAL_OIL | Status: DC | PRN
  Administered 2018-08-06: 1 via TOPICAL
  Filled 2018-08-06: qty 120

## 2018-08-06 MED ORDER — ZOLPIDEM TARTRATE 5 MG PO TABS: 5.0000 mg | ORAL_TABLET | Freq: Every evening | ORAL | Status: DC | PRN

## 2018-08-06 MED ORDER — LIDOCAINE HCL (PF) 1 % IJ SOLN
INTRAMUSCULAR | Status: DC | PRN
  Administered 2018-08-06: 13 mL via EPIDURAL

## 2018-08-06 MED ORDER — WITCH HAZEL-GLYCERIN EX PADS: 1.0000 "application " | MEDICATED_PAD | CUTANEOUS | Status: DC | PRN

## 2018-08-06 MED ORDER — DIPHENHYDRAMINE HCL 25 MG PO CAPS: 25.0000 mg | ORAL_CAPSULE | Freq: Four times a day (QID) | ORAL | Status: DC | PRN

## 2018-08-06 MED ORDER — BENZOCAINE-MENTHOL 20-0.5 % EX AERO
1.0000 "application " | INHALATION_SPRAY | CUTANEOUS | Status: DC | PRN
  Administered 2018-08-08: 1 via TOPICAL
  Filled 2018-08-06: qty 56

## 2018-08-06 MED ORDER — SODIUM CHLORIDE 0.9 % IV SOLN
1.0000 g | INTRAVENOUS | Status: DC
  Filled 2018-08-06 (×4): qty 1000

## 2018-08-06 MED ORDER — DIBUCAINE 1 % RE OINT: 1.0000 "application " | TOPICAL_OINTMENT | RECTAL | Status: DC | PRN

## 2018-08-06 MED ORDER — OXYTOCIN 40 UNITS IN LACTATED RINGERS INFUSION - SIMPLE MED
INTRAVENOUS | Status: AC
  Filled 2018-08-06: qty 1000

## 2018-08-06 MED ORDER — LACTATED RINGERS IV SOLN: 500.0000 mL | INTRAVENOUS | Status: DC | PRN

## 2018-08-06 MED ORDER — ACETAMINOPHEN 325 MG PO TABS
650.0000 mg | ORAL_TABLET | ORAL | Status: DC | PRN
  Administered 2018-08-06 – 2018-08-07 (×4): 650 mg via ORAL
  Filled 2018-08-06 (×4): qty 2

## 2018-08-06 MED ORDER — SIMETHICONE 80 MG PO CHEW: 80.0000 mg | CHEWABLE_TABLET | ORAL | Status: DC | PRN

## 2018-08-06 MED ORDER — LIDOCAINE HCL (PF) 1 % IJ SOLN
30.0000 mL | INTRAMUSCULAR | Status: DC | PRN
  Administered 2018-08-06: 30 mL via SUBCUTANEOUS
  Filled 2018-08-06: qty 30

## 2018-08-06 MED ORDER — IBUPROFEN 600 MG PO TABS
600.0000 mg | ORAL_TABLET | Freq: Four times a day (QID) | ORAL | Status: DC
  Administered 2018-08-06 – 2018-08-08 (×7): 600 mg via ORAL
  Filled 2018-08-06 (×8): qty 1

## 2018-08-06 MED ORDER — ONDANSETRON HCL 4 MG/2ML IJ SOLN: 4.0000 mg | Freq: Four times a day (QID) | INTRAMUSCULAR | Status: DC | PRN

## 2018-08-06 MED ORDER — LACTATED RINGERS IV SOLN: INTRAVENOUS | Status: DC

## 2018-08-06 MED ORDER — LACTATED RINGERS IV SOLN: 500.0000 mL | Freq: Once | INTRAVENOUS | Status: DC

## 2018-08-06 MED ORDER — NALBUPHINE HCL 10 MG/ML IJ SOLN
5.0000 mg | Freq: Once | INTRAMUSCULAR | Status: AC
  Administered 2018-08-06: 5 mg via INTRAMUSCULAR
  Filled 2018-08-06: qty 1

## 2018-08-06 MED ORDER — FLEET ENEMA 7-19 GM/118ML RE ENEM: 1.0000 | ENEMA | RECTAL | Status: DC | PRN

## 2018-08-06 NOTE — Discharge Instructions (Signed)
Braxton Hicks Contractions °Contractions of the uterus can occur throughout pregnancy, but they are not always a sign that you are in labor. You may have practice contractions called Braxton Hicks contractions. These false labor contractions are sometimes confused with true labor. °What are Braxton Hicks contractions? °Braxton Hicks contractions are tightening movements that occur in the muscles of the uterus before labor. Unlike true labor contractions, these contractions do not result in opening (dilation) and thinning of the cervix. Toward the end of pregnancy (32-34 weeks), Braxton Hicks contractions can happen more often and may become stronger. These contractions are sometimes difficult to tell apart from true labor because they can be very uncomfortable. You should not feel embarrassed if you go to the hospital with false labor. °Sometimes, the only way to tell if you are in true labor is for your health care provider to look for changes in the cervix. The health care provider will do a physical exam and may monitor your contractions. If you are not in true labor, the exam should show that your cervix is not dilating and your water has not broken. °If there are other health problems associated with your pregnancy, it is completely safe for you to be sent home with false labor. You may continue to have Braxton Hicks contractions until you go into true labor. °How to tell the difference between true labor and false labor °True labor °· Contractions last 30-70 seconds. °· Contractions become very regular. °· Discomfort is usually felt in the top of the uterus, and it spreads to the lower abdomen and low back. °· Contractions do not go away with walking. °· Contractions usually become more intense and increase in frequency. °· The cervix dilates and gets thinner. °False labor °· Contractions are usually shorter and not as strong as true labor contractions. °· Contractions are usually irregular. °· Contractions  are often felt in the front of the lower abdomen and in the groin. °· Contractions may go away when you walk around or change positions while lying down. °· Contractions get weaker and are shorter-lasting as time goes on. °· The cervix usually does not dilate or become thin. °Follow these instructions at home: °· Take over-the-counter and prescription medicines only as told by your health care provider. °· Keep up with your usual exercises and follow other instructions from your health care provider. °· Eat and drink lightly if you think you are going into labor. °· If Braxton Hicks contractions are making you uncomfortable: °? Change your position from lying down or resting to walking, or change from walking to resting. °? Sit and rest in a tub of warm water. °? Drink enough fluid to keep your urine pale yellow. Dehydration may cause these contractions. °? Do slow and deep breathing several times an hour. °· Keep all follow-up prenatal visits as told by your health care provider. This is important. °Contact a health care provider if: °· You have a fever. °· You have continuous pain in your abdomen. °Get help right away if: °· Your contractions become stronger, more regular, and closer together. °· You have fluid leaking or gushing from your vagina. °· You pass blood-tinged mucus (bloody show). °· You have bleeding from your vagina. °· You have low back pain that you never had before. °· You feel your baby’s head pushing down and causing pelvic pressure. °· Your baby is not moving inside you as much as it used to. °Summary °· Contractions that occur before labor are called Braxton   Hicks contractions, false labor, or practice contractions. °· Braxton Hicks contractions are usually shorter, weaker, farther apart, and less regular than true labor contractions. True labor contractions usually become progressively stronger and regular and they become more frequent. °· Manage discomfort from Braxton Hicks contractions by  changing position, resting in a warm bath, drinking plenty of water, or practicing deep breathing. °This information is not intended to replace advice given to you by your health care provider. Make sure you discuss any questions you have with your health care provider. °Document Released: 12/27/2016 Document Revised: 12/27/2016 Document Reviewed: 12/27/2016 °Elsevier Interactive Patient Education © 2018 [employer] ° °Fetal Movement Counts °Patient Name: ________________________________________________ Patient Due Date: ____________________ °What is a fetal movement count? °A fetal movement count is the number of times that you feel your baby move during a certain amount of time. This may also be called a fetal kick count. A fetal movement count is recommended for every pregnant woman. You may be asked to start counting fetal movements as early as week 28 of your pregnancy. °Pay attention to when your baby is most active. You may notice your baby's sleep and wake cycles. You may also notice things that make your baby move more. You should do a fetal movement count: °· When your baby is normally most active. °· At the same time each day. ° °A good time to count movements is while you are resting, after having something to eat and drink. °How do I count fetal movements? °1. Find a quiet, comfortable area. Sit, or lie down on your side. °2. Write down the date, the start time and stop time, and the number of movements that you felt between those two times. Take this information with you to your health care visits. °3. For 2 hours, count kicks, flutters, swishes, rolls, and jabs. You should feel at least 10 movements during 2 hours. °4. You may stop counting after you have felt 10 movements. °5. If you do not feel 10 movements in 2 hours, have something to eat and drink. Then, keep resting and counting for 1 hour. If you feel at least 4 movements during that hour, you may stop counting. °Contact a health care  provider if: °· You feel fewer than 4 movements in 2 hours. °· Your baby is not moving like he or she usually does. °Date: ____________ Start time: ____________ Stop time: ____________ Movements: ____________ °Date: ____________ Start time: ____________ Stop time: ____________ Movements: ____________ °Date: ____________ Start time: ____________ Stop time: ____________ Movements: ____________ °Date: ____________ Start time: ____________ Stop time: ____________ Movements: ____________ °Date: ____________ Start time: ____________ Stop time: ____________ Movements: ____________ °Date: ____________ Start time: ____________ Stop time: ____________ Movements: ____________ °Date: ____________ Start time: ____________ Stop time: ____________ Movements: ____________ °Date: ____________ Start time: ____________ Stop time: ____________ Movements: ____________ °Date: ____________ Start time: ____________ Stop time: ____________ Movements: ____________ °This information is not intended to replace advice given to you by your health care provider. Make sure you discuss any questions you have with your health care provider. °Document Released: 09/12/2006 Document Revised: 04/11/2016 Document Reviewed: 09/22/2015 °Elsevier Interactive Patient Education © 2018 [employer] ° °

## 2018-08-06 NOTE — MAU Note (Signed)
PT SAYS SHE WAS HERE - SENT  HOME  AT 4 PM-   VE- CLOSED.  UC  ARE STRONGER- FEELS LIKE  STABBING  IN LOWER ABD  - TO BACK.    THINKS [facility]-  AT 0100- HAD  WATER IN UNDERWEAR-  NOTHING  COMING OUT  NOW.   DENIES HSV AND  MRSA.

## 2018-08-06 NOTE — Discharge Summary (Addendum)
Postpartum Discharge Summary     Patient Name: Unknown FoleyLatoya M XXXOliver DOB: [birth year] MRN: [id number]  Date of admission: 08/06/2018 Delivering Provider: Leeroy BockANDERSON, CHELSEY L   Date of discharge: 08/08/2018  Admitting diagnosis: preg, labor eval Intrauterine pregnancy: 329w5d     Secondary diagnosis:  Active Problems:   Postpartum care following vaginal delivery  Additional problems: None     Discharge diagnosis: Term Pregnancy Delivered                                                                                                Post partum procedures:None  Augmentation: AROM  Complications: None  Hospital course:  Onset of Labor With Vaginal Delivery     32 y.o. yo [id number] at 159w5d was admitted in Active Labor on 08/06/2018. Patient had an uncomplicated labor course as follows:  Membrane Rupture Time/Date: 2:11 PM ,08/06/2018   Intrapartum Procedures: Episiotomy: None [1]                                         Lacerations:  Periurethral [8]  Patient had a delivery of a Viable infant. 08/06/2018  Information for the patient's newborn:  Andris FlurryXXXOliver, Boy Zanya [[id number]]  Delivery Method: Vaginal, Spontaneous(Filed from Delivery Summary)    Pateint had an uncomplicated postpartum course.  She is ambulating, tolerating a regular diet, passing flatus, and urinating well. Patient is discharged home in stable condition on 08/08/18.   Magnesium Sulfate recieved: No BMZ received: No  Physical exam  Vitals:   08/07/18 0550 08/07/18 1444 08/07/18 2253 08/08/18 0521  BP: 106/73 105/69 108/79 94/65  Pulse: 78 74 77 75  Resp: 16 16 17 16   Temp: 98.6 F (37 C) 98.3 F (36.8 C)  97.9 F (36.6 C)  TempSrc: Axillary Oral  Oral  SpO2:  98% 100% 100%   General: alert, cooperative and no distress Lochia: appropriate Uterine Fundus: firm Incision: N/A DVT Evaluation: No evidence of DVT seen on physical exam. Negative Homan's sign. No cords or calf tenderness. No significant  calf/ankle edema. Labs: Lab Results  Component Value Date   WBC 11.9 (H) 08/07/2018   HGB 11.6 (L) 08/07/2018   HCT 35.5 (L) 08/07/2018   MCV 97.5 08/07/2018   PLT 197 08/07/2018   CMP Latest Ref Rng & Units 01/03/2016  Glucose 65 - 99 mg/dL 96  BUN 6 - 20 mg/dL 13  Creatinine 1.020.44 - 7.251.00 mg/dL 3.660.62  Sodium 440135 - 347145 mmol/L 138  Potassium 3.5 - 5.1 mmol/L 4.0  Chloride 101 - 111 mmol/L 106  CO2 22 - 32 mmol/L 22  Calcium 8.9 - 10.3 mg/dL 9.3  Total Protein 6.0 - 8.3 g/dL -  Total Bilirubin 0.3 - 1.2 mg/dL -  Alkaline Phos 39 - 425117 U/L -  AST 0 - 37 U/L -  ALT 0 - 35 U/L -    Discharge instruction: per After Visit Summary and "Baby and Me Booklet".  After visit meds:  Allergies as of 08/08/2018   No  Known Allergies     Medication List    TAKE these medications   fluticasone 50 MCG/ACT nasal spray Commonly known as:  FLONASE Place 1 spray into both nostrils daily.   ibuprofen 600 MG tablet Commonly known as:  ADVIL,MOTRIN Take 1 tablet (600 mg total) by mouth every 6 (six) hours.   phenol 1.4 % Liqd Commonly known as:  CHLORASEPTIC Use as directed 1 spray in the mouth or throat as needed for throat irritation / pain.   prenatal multivitamin Tabs tablet Take 1 tablet by mouth daily at 12 noon.   senna-docusate 8.6-50 MG tablet Commonly known as:  Senokot-S Take 2 tablets by mouth daily for 7 days. Start taking on:  August 09, 2018       Diet: routine diet  Activity: Advance as tolerated. Pelvic rest for 6 weeks.   Outpatient follow up:4 weeks Follow up Appt:No future appointments. Follow up Visit:   Please schedule this patient for Postpartum visit in: 4 weeks with the following provider: Any provider For C/S patients schedule nurse incision check in weeks 2 weeks: no Low risk pregnancy complicated by: None Delivery mode:  SVD Anticipated Birth Control:  Condoms and OCPs PP Procedures needed: None  Schedule Integrated BH visit: no  Newborn  Data: Live born female  Birth Weight: 6 lb 13.9 oz (3115 g) APGAR: 9, 9  Newborn Delivery   Birth date/time:  08/06/2018 14:16:00 Delivery type:  Vaginal, Spontaneous     Baby Feeding: Breast Disposition:home with mother   08/08/2018 Awilda Metro, MD   OB FELLOW DISCHARGE ATTESTATION  I have seen and examined this patient and agree with above documentation in the resident's note.   Marcy Siren, D.O. OB Fellow  08/08/2018, 8:09 AM

## 2018-08-06 NOTE — H&P (Addendum)
LABOR AND DELIVERY ADMISSION HISTORY AND PHYSICAL NOTE  Sarah Foster is a 32 y.o. female [phone] by L/20 with IUP at [redacted]w[redacted]d presenting for SOL. She reports positive fetal movement. She endorses leakage of fluid but no vaginal bleeding. Negative fern test. Vertex presentation.   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 Anxiety   . GERD (gastroesophageal reflux disease)    TUMS  . Vaginal Pap smear, abnormal 2017   pt stated needs colposcopy after delivery    Past Surgical History: Past Surgical History:  Procedure Laterality Date  . NO PAST SURGERIES      Obstetrical History: OB History    Gravida  4   Para  1   Term  1   Preterm  0   AB  2   Living  1     SAB  2   TAB  0   Ectopic  0   Multiple  0   Live Births  1           Social History: Social History   Socioeconomic History  . Marital status: Single    Spouse name: Not on file  . Number of children: Not on file  . Years of education: Not on file  . Highest education level: Not on file  Occupational History  . Not on file  Social Needs  . Financial resource strain: Not on file  . Food insecurity:    Worry: Not on file    Inability: Not on file  . Transportation needs:    Medical: Not on file    Non-medical: Not on file  Tobacco Use  . Smoking status: Former Smoker    Packs/day: 0.50    Types: Cigarettes  . Smokeless tobacco: Former Engineer, water and Sexual Activity  . Alcohol use: Not Currently    Comment: occas  . Drug use: Not Currently    Types: Marijuana    Comment: Not during current pregnanct 2019  . Sexual activity: Yes  Lifestyle  . Physical activity:    Days per week: Not on file    Minutes per session: Not on file  . Stress: Not on  file  Relationships  . Social connections:    Talks on phone: Not on file    Gets together: Not on file    Attends religious service: Not on file    Active member of club or organization: Not on file    Attends meetings of clubs or organizations: Not on file    Relationship status: Not on file  Other Topics Concern  . Not on file  Social History Narrative  . Not on file    Family History: History reviewed. No pertinent family history.  Allergies: No Known Allergies  Medications Prior to Admission  Medication Sig Dispense Refill Last Dose  . fluticasone (FLONASE) 50 MCG/ACT nasal spray Place 1 spray into both nostrils daily. (Patient not taking: Reported on 08/05/2018) 16 g 2 Not Taking at Sarah time  . phenol (CHLORASEPTIC) 1.4 % LIQD Use as directed 1 spray in the mouth or throat as needed for throat irritation / pain. (Patient not taking: Reported on 08/05/2018) 118 mL 0 Not Taking at Sarah time  . Prenatal Vit-Fe Fumarate-FA (PRENATAL MULTIVITAMIN) TABS tablet Take 1 tablet by mouth  daily at 12 noon.   08/05/2018 at Sarah time     Review of Systems  All systems reviewed and negative except as stated in HPI  Physical Exam Blood pressure 97/82, pulse 85, temperature 98.4 F (36.9 C), temperature source Axillary, resp. rate 16, SpO2 100 %, not currently breastfeeding. General appearance: alert, oriented, NAD Lungs: normal respiratory effort Heart: regular rate Abdomen: soft, non-tender; gravid, FH appropriate for GA Extremities: No calf swelling or tenderness Presentation: cephalic Fetal monitoring: category I, baseline HR 135, positive accelerations, negative decels  Uterine activity: 3-4 minutes moderate Dilation: Lip/rim Exam by:: S Nix RN  Prenatal labs: ABO, Rh:   O pos Antibody:  neg Rubella:   immune RPR:   non reactive HBsAg:   neg HIV:   non reactive GC/Chlamydia: negative GBS:   positive- given ampicillin  2-hr GTT: negative Genetic screening:   Sarah Anatomy US: wnl  Prenatal Transfer Tool  Maternal Diabetes: No Genetic Screening: Sarah Maternal Ultrasounds/Referrals: Normal Fetal Ultrasounds or other Referrals:  None Maternal Substance Abuse:  Yes:  Type: Marijuana at beginning of pregnancy and negative later in pregnancy Significant Maternal Medications:  None Significant Maternal Lab Results: None  Results for orders placed or performed during the hospital encounter of 08/06/18 (from the past 24 hour(s))  CBC   Collection Time: 08/06/18  9:43 AM  Result Value Ref Range   WBC 12.2 (H) 4.0 - 10.5 K/uL   RBC 3.96 3.87 - 5.11 MIL/uL   Hemoglobin 12.8 12.0 - 15.0 g/dL   HCT 11.938.4 14.736.0 - 82.946.0 %   MCV 97.0 80.0 - 100.0 fL   MCH 32.3 26.0 - 34.0 pg   MCHC 33.3 30.0 - 36.0 g/dL   RDW 56.213.4 13.011.5 - 86.515.5 %   Platelets 219 150 - 400 K/uL   nRBC 0.0 0.0 - 0.2 %  Results for orders placed or performed during the hospital encounter of 08/06/18 (from the past 24 hour(s))  Amnisure rupture [facility] membrane (rom)[facility]   Collection Time: 08/06/18  3:05 AM  Result Value Ref Range   Amnisure ROM NEGATIVE   Fern Test   Collection Time: 08/06/18  3:23 AM  Result Value Ref Range   POCT Fern Test Negative = intact amniotic membranes   Results for orders placed or performed during the hospital encounter of 08/05/18 (from the past 24 hour(s))  Wet prep, genital   Collection Time: 08/05/18  4:23 PM  Result Value Ref Range   Yeast Wet Prep HPF POC PRESENT (A) NONE SEEN   Trich, Wet Prep NONE SEEN NONE SEEN   Clue Cells Wet Prep HPF POC NONE SEEN NONE SEEN   WBC, Wet Prep HPF POC FEW (A) NONE SEEN   Sperm NONE SEEN     Patient Active Problem List   Diagnosis Date Noted  . Acid reflux 08/10/2016  . Cervical intraepithelial neoplasia grade III with severe dysplasia 08/10/2016  . Abnormal maternal serum screening test 05/29/2016    Assessment: Sarah Foster is a 32 y.o. [id number] at 2627w5d here for SOL  #Labor: SOL, membranes  intact. Presents at 8cm and complete with lip #Pain: Epidural in place #FWB: Category I, positive accels, no decels  EFW 7lbs by Leopold's  #ID:  GBS positive- will give ampicillin  #MOF: breast #MOC:OCPs, condoms #Circ:  Sarah  Jamelle Rushinghelsey Anderson DO 08/06/2018, 10:48 AM  Attestation: I have seen this patient and agree with the resident's documentation. I have examined them separately, and we have discussed  the plan of care.  Cristal Deer. Earlene Plater, DO OB/GYN Fellow

## 2018-08-06 NOTE — Progress Notes (Signed)
To L&D via stretcher with Fabian NovemberM. Bhambri, CNM in attendance.

## 2018-08-06 NOTE — Anesthesia Procedure Notes (Signed)
Epidural Patient location during procedure: OB Start time: 08/06/2018 10:15 AM End time: 08/06/2018 10:29 AM  Staffing Anesthesiologist: Lowella CurbMiller, Aston Lawhorn Ray, MD Performed: anesthesiologist   Preanesthetic Checklist Completed: patient identified, site marked, surgical consent, pre-op evaluation, timeout performed, IV checked, risks and benefits discussed and monitors and equipment checked  Epidural Patient position: sitting Prep: ChloraPrep Patient monitoring: heart rate, cardiac monitor, continuous pulse ox and blood pressure Approach: midline Location: L2-L3 Injection technique: LOR saline  Needle:  Needle type: Tuohy  Needle gauge: 17 G Needle length: 9 cm Needle insertion depth: 4 cm Catheter type: closed end flexible Catheter size: 20 Guage Catheter at skin depth: 9 cm Test dose: negative  Assessment Events: blood not aspirated, injection not painful, no injection resistance, negative IV test and no paresthesia  Additional Notes Reason for block:procedure for pain

## 2018-08-06 NOTE — Anesthesia Preprocedure Evaluation (Signed)
Anesthesia Evaluation  Patient identified by MRN, date of birth, ID band Patient awake    Reviewed: Allergy & Precautions, H&P , NPO status , Patient's Chart, lab work & pertinent test results  History of Anesthesia Complications Negative for: history of anesthetic complications  Airway Mallampati: II  TM Distance: >3 FB Neck ROM: full    Dental no notable dental hx. (+) Teeth Intact   Pulmonary neg pulmonary ROS, former smoker,    Pulmonary exam normal breath sounds clear to auscultation       Cardiovascular negative cardio ROS Normal cardiovascular exam Rhythm:regular Rate:Normal     Neuro/Psych negative neurological ROS  negative psych ROS   GI/Hepatic Neg liver ROS, GERD  ,  Endo/Other  negative endocrine ROS  Renal/GU negative Renal ROS  negative genitourinary   Musculoskeletal   Abdominal   Peds  Hematology negative hematology ROS (+)   Anesthesia Other Findings   Reproductive/Obstetrics (+) Pregnancy                             Anesthesia Physical  Anesthesia Plan  ASA: II  Anesthesia Plan: Epidural   Post-op Pain Management:    Induction:   PONV Risk Score and Plan:   Airway Management Planned:   Additional Equipment:   Intra-op Plan:   Post-operative Plan:   Informed Consent: I have reviewed the patients History and Physical, chart, labs and discussed the procedure including the risks, benefits and alternatives for the proposed anesthesia with the patient or authorized representative who has indicated his/her understanding and acceptance.     Plan Discussed with:   Anesthesia Plan Comments:         Anesthesia Quick Evaluation

## 2018-08-07 LAB — CBC
HEMATOCRIT: 35.5 % — AB (ref 36.0–46.0)
Hemoglobin: 11.6 g/dL — ABNORMAL LOW (ref 12.0–15.0)
MCH: 31.9 pg (ref 26.0–34.0)
MCHC: 32.7 g/dL (ref 30.0–36.0)
MCV: 97.5 fL (ref 80.0–100.0)
NRBC: 0 % (ref 0.0–0.2)
PLATELETS: 197 10*3/uL (ref 150–400)
RBC: 3.64 MIL/uL — ABNORMAL LOW (ref 3.87–5.11)
RDW: 13.6 % (ref 11.5–15.5)
WBC: 11.9 10*3/uL — AB (ref 4.0–10.5)

## 2018-08-07 LAB — RPR: RPR Ser Ql: NONREACTIVE

## 2018-08-07 NOTE — Anesthesia Postprocedure Evaluation (Signed)
Anesthesia Post Note  Patient: Sarah FoleyLatoya M Foster  Procedure(s) Performed: AN AD HOC LABOR EPIDURAL     Patient location during evaluation: Mother Baby Anesthesia Type: Epidural Level of consciousness: awake and alert Pain management: pain level controlled Vital Signs Assessment: post-procedure vital signs reviewed and stable Respiratory status: spontaneous breathing, nonlabored ventilation and respiratory function stable Cardiovascular status: stable Postop Assessment: no headache, no backache and epidural receding Anesthetic complications: no    Last Vitals:  Vitals:   08/07/18 0309 08/07/18 0550  BP: 107/68 106/73  Pulse: 76 78  Resp: 16 16  Temp: 36.9 C 37 C  SpO2:      Last Pain:  Vitals:   08/07/18 0550  TempSrc: Axillary  PainSc:    Pain Goal:                 Junious SilkGILBERT,Elley Harp
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
## 2018-08-07 NOTE — Progress Notes (Signed)
POSTPARTUM PROGRESS NOTE  Post Partum Day 1  Subjective:  Unknown FoleyLatoya M Foster is a 32 y.o. [id number] s/p SVD at 8562w5d.  She reports she is doing well. No acute events overnight. She denies any problems with ambulating, voiding or po intake. Denies nausea or vomiting.  Pain is well controlled.  Lochia is mild, has used 1 pad since delivery. Denies dizziness, lightheadedness, chest pain, shortness of breath.   Objective: Blood pressure 106/73, pulse 78, temperature 98.6 F (37 C), temperature source Axillary, resp. rate 16, SpO2 100 %, unknown if currently breastfeeding.  Physical Exam:  General: alert, cooperative and no distress Chest: no respiratory distress Heart:regular rate, distal pulses intact Abdomen: soft, nontender,  Uterine Fundus: firm, appropriately tender DVT Evaluation: No calf swelling or tenderness Extremities: No edema Skin: warm, dry  Recent Labs    08/06/18 0943 08/07/18 0537  HGB 12.8 11.6*  HCT 38.4 35.5*     Assessment/Plan: Unknown FoleyLatoya M Foster is a 32 y.o. [id number] s/p SVD at 6562w5d.  PPD#1 - Doing well - Routine postpartum care - H/H appropriate  Contraception: OCPs and condoms Feeding: Breast Circumcision: Outpatient   Dispo: Plan for discharge tomorrow.   LOS: 1 day   Sarah Rieser, PA-S. 08/07/2018, 7:22 AM

## 2018-08-07 NOTE — Lactation Note (Signed)
This note was copied from a baby's chart. Lactation Consultation Note  Patient Name: Sarah Threasa AlphaLatoya XXXOliver HYWVP'XToday's Date: 08/07/2018 Reason for consult: Initial assessment;1st time breastfeeding;Term  Visited with P2 Mom of 25 hr old term baby.  Mom has been primarily formula feeding by bottle.  Mom states she offers breast first, and when baby doesn't latch, she gives formula.   Encouraged Mom to keep baby STS, and call for latch assist when baby shows feeding cues, discussed these. Mom states that she doesn't have much of anything when she hand expresses, and "two drops isn't enough".  Education on supply meets demand reviewed.  Lactation brochure left in room, and Mom aware of IP and OP lactation support.  Mom to call for assistance.   Interventions Interventions: Breast feeding basics reviewed;Skin to skin;Breast massage;Hand express;DEBP;Coconut oil  Lactation Tools Discussed/Used Tools: Pump;Bottle;Coconut oil Breast pump type: Double-Electric Breast Pump WIC Program: Yes   Consult Status Consult Status: Follow-up Date: 08/08/18 Follow-up type: In-patient    Sarah Foster, Sarah Foster 08/07/2018, 3:17 PM

## 2018-08-07 NOTE — Progress Notes (Signed)
Pt educated about safe-sleep. Reminded patient that baby should sleep on a flat surface without any heavy blankets.

## 2018-08-08 ENCOUNTER — Other Ambulatory Visit: Payer: Self-pay

## 2018-08-08 ENCOUNTER — Encounter (HOSPITAL_COMMUNITY): Payer: Self-pay | Admitting: Student in an Organized Health Care Education/Training Program

## 2018-08-08 MED ORDER — IBUPROFEN 600 MG PO TABS: 600.0000 mg | ORAL_TABLET | Freq: Four times a day (QID) | ORAL | 0 refills | Status: DC

## 2018-08-08 MED ORDER — SENNOSIDES-DOCUSATE SODIUM 8.6-50 MG PO TABS: 2.0000 | ORAL_TABLET | ORAL | 0 refills | Status: AC
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
## 2018-08-08 NOTE — Lactation Note (Signed)
This note was copied from a baby's chart. Lactation Consultation Note  Patient Name: Sarah Foster XXXOliver EAVWU'JToday's Date: 08/08/2018 Reason for consult: Follow-up assessment;Term  P2 mother whose infant is now 1543 hours old.  Mother had just finished giving a bottle and baby was asleep in her arms.  In reviewing her chart I noticed that mother has been primarily bottle feeding.  She voiced concern that "he won't do anything" at my breast.  I offered to help at the next feeding and mother was hesitant.  I asked her if she had been hand expressing or pumping to obtain colostrum.  She demonstrated hand expression and was able to obtain drops from the right breast.  Explained "supply and demand"  and reinforced the importance of feeding at least 8-12 times/24 hours or sooner if baby shows cues.  Baby was in a deep sleep and showed no movement but I demonstrated how she could awaken him, techniques for keeping him awake at the breast and how to obtain a deep latch.    Engorgement prevention/treatment discussed.  Mother stated she had this before and remembers how painful it felt.  Explained how to make ice packs.  [facility] and possibly pick up a pump on Monday after her pediatrician's visit.  Mother stated she would like to do that.  Mother has a manual pump and has no further questions/concerns related to breast feeding.   Maternal Data Formula Feeding for Exclusion: No Has patient been taught Hand Expression?: Yes Does the patient have breastfeeding experience prior to this delivery?: Yes  Feeding Feeding Type: Bottle Fed - Formula  LATCH Score                   Interventions    Lactation Tools Discussed/Used WIC Program: Yes   Consult Status Consult Status: Complete Date: 08/08/18 Follow-up type: Call as needed    Irene PapBeth R Taygen Newsome 08/08/2018, 9:39 AM

## 2018-09-14 ENCOUNTER — Encounter (HOSPITAL_COMMUNITY): Payer: Self-pay

## 2018-09-14 ENCOUNTER — Emergency Department (HOSPITAL_COMMUNITY)
Admission: EM | Admit: 2018-09-14 | Discharge: 2018-09-14 | Disposition: A | Discharge status: Home / Self Care | Payer: Medicaid Other | Attending: Emergency Medicine | Admitting: Emergency Medicine

## 2018-09-14 DIAGNOSIS — Z79899 Other long term (current) drug therapy: Secondary | ICD-10-CM | POA: Insufficient documentation

## 2018-09-14 DIAGNOSIS — H6591 Unspecified nonsuppurative otitis media, right ear: Secondary | ICD-10-CM | POA: Diagnosis not present

## 2018-09-14 DIAGNOSIS — J029 Acute pharyngitis, unspecified: Secondary | ICD-10-CM | POA: Diagnosis present

## 2018-09-14 DIAGNOSIS — H6691 Otitis media, unspecified, right ear: Secondary | ICD-10-CM

## 2018-09-14 DIAGNOSIS — F17210 Nicotine dependence, cigarettes, uncomplicated: Secondary | ICD-10-CM | POA: Diagnosis not present

## 2018-09-14 LAB — GROUP A STREP BY PCR: GROUP A STREP BY PCR: NOT DETECTED

## 2018-09-14 MED ORDER — AMOXICILLIN 500 MG PO CAPS: 500.0000 mg | ORAL_CAPSULE | Freq: Three times a day (TID) | ORAL | 0 refills | Status: DC

## 2018-09-14 NOTE — Discharge Instructions (Signed)
Return if any problems.

## 2018-09-14 NOTE — ED Triage Notes (Signed)
Pt presents for 4 day hx of fever with sore throat and bilateral ear pain.

## 2018-09-14 NOTE — ED Provider Notes (Signed)
[facility] EMERGENCY DEPARTMENT Provider Note   CSN: [id number] Arrival date & time: 09/14/18  1057     History   Chief Complaint Chief Complaint  Patient presents with  . Sore Throat    HPI Sarah Foster is a 33 y.o. female.  The history is provided by the patient. No language interpreter was used.  Sore Throat  This is a new problem. The current episode started 2 days ago. The problem occurs hourly. The problem has been gradually worsening. Pertinent negatives include no chest pain and no abdominal pain. Nothing aggravates the symptoms. Nothing relieves the symptoms. She has tried nothing for the symptoms. The treatment provided no relief.  Pt complains of a sore throat and ear pain   Past Medical History:  Diagnosis Date  . Abnormal maternal serum screening test 05/29/2016   1 in 171 DSR from [facility] screen; patient declined NIPS and amniocentesis  . Anxiety   . GERD (gastroesophageal reflux disease)    TUMS  . Indication for care in labor or delivery 08/06/2018  . Vaginal Pap smear, abnormal 2017   pt stated needs colposcopy after delivery    Patient Active Problem List   Diagnosis Date Noted  . Postpartum care following vaginal delivery 08/08/2018  . Acid reflux 08/10/2016  . Cervical intraepithelial neoplasia grade III with severe dysplasia 08/10/2016    Past Surgical History:  Procedure Laterality Date  . NO PAST SURGERIES       OB History    Gravida  4   Para  2   Term  2   Preterm  0   AB  2   Living  2     SAB  2   TAB  0   Ectopic  0   Multiple  0   Live Births  2            Home Medications    Prior to Admission medications   Medication Sig Start Date End Date Taking? Authorizing Provider  amoxicillin (AMOXIL) 500 MG capsule Take 1 capsule (500 mg total) by mouth 3 (three) times daily. 09/14/18   Elson Areas, PA-C  fluticasone (FLONASE) 50 MCG/ACT nasal spray Place 1 spray into both nostrils  daily. Patient not taking: Reported on 08/05/2018 10/13/17   Aviva Kluver B, PA-C  ibuprofen (ADVIL,MOTRIN) 600 MG tablet Take 1 tablet (600 mg total) by mouth every 6 (six) hours. 08/08/18   Peiffer, Shawn Route, MD  phenol (CHLORASEPTIC) 1.4 % LIQD Use as directed 1 spray in the mouth or throat as needed for throat irritation / pain. Patient not taking: Reported on 08/05/2018 10/13/17   Elisha Ponder, PA-C  Prenatal Vit-Fe Fumarate-FA (PRENATAL MULTIVITAMIN) TABS tablet Take 1 tablet by mouth daily at 12 noon.    [provider]    Family History No family history on file.  Social History Social History   Tobacco Use  . Smoking status: Former Smoker    Packs/day: 0.50    Types: Cigarettes  . Smokeless tobacco: Former Engineer, water Use Topics  . Alcohol use: Not Currently    Comment: occas  . Drug use: Not Currently    Types: Marijuana    Comment: Not during current pregnanct 2019     Allergies   Patient has no known allergies.   Review of Systems Review of Systems  Cardiovascular: Negative for chest pain.  Gastrointestinal: Negative for abdominal pain.  All other systems reviewed and are negative.  Physical Exam Updated Vital Signs BP 112/80 (BP Location: Right Arm)   Pulse 87   Temp 98.9 F (37.2 C) (Oral)   Resp 16   SpO2 98%   Physical Exam Constitutional:      Appearance: She is well-developed.  HENT:     Head: Normocephalic and atraumatic.     Ears:     Comments: tms erythemaotus    Mouth/Throat:     Pharynx: Uvula midline. Posterior oropharyngeal erythema present. No oropharyngeal exudate.  Eyes:     Conjunctiva/sclera: Conjunctivae normal.     Pupils: Pupils are equal, round, and reactive to light.  Neck:     Musculoskeletal: Normal range of motion and neck supple.  Cardiovascular:     Rate and Rhythm: Normal rate.  Pulmonary:     Effort: Pulmonary effort is normal.  Abdominal:     Palpations: Abdomen is soft.  Musculoskeletal:  Normal range of motion.  Skin:    General: Skin is warm and dry.     Capillary Refill: Capillary refill takes less than 2 seconds.  Neurological:     General: No focal deficit present.     Mental Status: She is alert and oriented to person, place, and time.      ED Treatments / Results  Labs (all labs ordered are listed, but only abnormal results are displayed) Labs Reviewed  GROUP A STREP BY PCR    EKG None  Radiology No results found.  Procedures Procedures (including critical care time)  Medications Ordered in ED Medications - No data to display   Initial Impression / Assessment and Plan / ED Course  I have reviewed the triage vital signs and the nursing notes.  Pertinent labs & imaging results that were available during my care of the patient were reviewed by me and considered in my medical decision making (see chart for details).       Final Clinical Impressions(s) / ED Diagnoses   Final diagnoses:  Pharyngitis, unspecified etiology  Right otitis media, unspecified otitis media type    ED Discharge Orders         Ordered    amoxicillin (AMOXIL) 500 MG capsule  3 times daily     09/14/18 1233        An After Visit Summary was printed and given to the patient.   Elson Areas, Cordelia Poche 09/14/18 1244    Tilden Fossa, MD 09/15/18 [id number]

## 2020-02-04 ENCOUNTER — Ambulatory Visit: Payer: Medicaid Other | Admitting: Obstetrics and Gynecology

## 2020-04-01 ENCOUNTER — Encounter: Payer: Self-pay | Admitting: *Deleted

## 2020-04-01 ENCOUNTER — Encounter: Payer: Self-pay | Admitting: Obstetrics & Gynecology

## 2020-04-01 ENCOUNTER — Ambulatory Visit (INDEPENDENT_AMBULATORY_CARE_PROVIDER_SITE_OTHER): Payer: Medicaid Other | Admitting: Obstetrics & Gynecology

## 2020-04-01 ENCOUNTER — Other Ambulatory Visit: Payer: Self-pay

## 2020-04-01 ENCOUNTER — Other Ambulatory Visit (HOSPITAL_COMMUNITY)
Admission: RE | Admit: 2020-04-01 | Discharge: 2020-04-01 | Disposition: A | Discharge status: Home / Self Care | Payer: Medicaid Other | Source: Ambulatory Visit | Attending: Obstetrics & Gynecology | Admitting: Obstetrics & Gynecology

## 2020-04-01 VITALS — BP 102/69 | HR 71 | Wt 148.1 lb

## 2020-04-01 DIAGNOSIS — Z3202 Encounter for pregnancy test, result negative: Secondary | ICD-10-CM

## 2020-04-01 DIAGNOSIS — R87611 Atypical squamous cells cannot exclude high grade squamous intraepithelial lesion on cytologic smear of cervix (ASC-H): Secondary | ICD-10-CM

## 2020-04-01 DIAGNOSIS — R87612 Low grade squamous intraepithelial lesion on cytologic smear of cervix (LGSIL): Secondary | ICD-10-CM

## 2020-04-01 LAB — POCT PREGNANCY, URINE: Preg Test, Ur: NEGATIVE

## 2020-04-01 NOTE — Progress Notes (Signed)
Here for colposcopy. Sarah Foster

## 2020-04-01 NOTE — Progress Notes (Signed)
Patient ID: Sarah Foster, female   DOB: 04/05/1986, 34 y.o.   MRN: [id number]  Chief Complaint  Patient presents with  . Colposcopy    HPI Sarah Foster is a 34 y.o. female.  [id number] Patient's last menstrual period was 03/23/2020.  HPI  Indications: Pap smear on June 2021 showed: ASC cannot exclude high grade lesion [facility]). Previous colposcopy: CIN 3 and in 2018. Prior cervical treatment: LLETZ.  Past Medical History:  Diagnosis Date  . Abnormal maternal serum screening test 05/29/2016   1 in 171 DSR from [facility] screen; patient declined NIPS and amniocentesis  . Anxiety   . GERD (gastroesophageal reflux disease)    TUMS  . Indication for care in labor or delivery 08/06/2018  . Vaginal Pap smear, abnormal 2017   pt stated needs colposcopy after delivery    Past Surgical History:  Procedure Laterality Date  . NO PAST SURGERIES      History reviewed. No pertinent family history.  Social History Social History   Tobacco Use  . Smoking status: Former Smoker    Packs/day: 0.50    Types: Cigarettes  . Smokeless tobacco: Former Clinical biochemist  . Vaping Use: Never used  Substance Use Topics  . Alcohol use: Yes    Comment: occas  . Drug use: Not Currently    Types: Marijuana    Comment: marijuana candy    No Known Allergies  Current Outpatient Medications  Medication Sig Dispense Refill  . fluticasone (FLONASE) 50 MCG/ACT nasal spray Place 1 spray into both nostrils daily. 16 g 2  . ibuprofen (ADVIL,MOTRIN) 600 MG tablet Take 1 tablet (600 mg total) by mouth every 6 (six) hours. 30 tablet 0  . Prenatal Vit-Fe Fumarate-FA (PRENATAL MULTIVITAMIN) TABS tablet Take 1 tablet by mouth daily at 12 noon.    Marland Kitchen amoxicillin (AMOXIL) 500 MG capsule Take 1 capsule (500 mg total) by mouth 3 (three) times daily. 30 capsule 0  . phenol (CHLORASEPTIC) 1.4 % LIQD Use as directed 1 spray in the mouth or throat as needed for throat irritation / pain. (Patient not taking: Reported on  08/05/2018) 118 mL 0   No current facility-administered medications for this visit.    Review of Systems Review of Systems  Constitutional: Negative.   Gastrointestinal: Negative.   Genitourinary: Negative for menstrual problem, pelvic pain, vaginal bleeding and vaginal discharge.    Blood pressure 102/69, pulse 71, weight 148 lb 1.6 oz (67.2 kg), last menstrual period 03/23/2020, unknown if currently breastfeeding.  Physical Exam Physical Exam Constitutional:      Appearance: Normal appearance.  Cardiovascular:     Rate and Rhythm: Normal rate.  Pulmonary:     Effort: Pulmonary effort is normal.  Genitourinary:    General: Normal vulva.  Neurological:     Mental Status: She is alert.  Psychiatric:        Mood and Affect: Mood normal.        Behavior: Behavior normal.     Data Reviewed Pap result, LEEP 2018  Assessment    Procedure Details  The risks and benefits of the procedure and Written informed consent obtained.  Speculum placed in vagina and excellent visualization of cervix achieved, cervix swabbed x 3 with acetic acid solution. Patient given informed consent, signed copy in the chart, time out was performed.  Placed in lithotomy position. Cervix viewed with speculum and colposcope after application of acetic acid.   Colposcopy adequate?  yes Acetowhite lesions? no Punctation? no  Mosaicism?  no Abnormal vasculature?  no Biopsies? Yes 600 ECC? yes  COMMENTS:  Patient was given post procedure instructions.    Scheryl Darter, MD  Specimens: ECC, Bx 600  Complications: none.     Plan    Specimens labelled and sent to Pathology.       Scheryl Darter 04/01/2020, 10:42 AM

## 2020-04-01 NOTE — Patient Instructions (Signed)
Colposcopy, Care After This sheet gives you information about how to care for yourself after your procedure. Your doctor may also give you more specific instructions. If you have problems or questions, contact your doctor. What can I expect after the procedure? If you did not have a tissue sample removed (did not have a biopsy), you may only have some spotting for a few days. You can go back to your normal activities. If you had a tissue sample removed, it is common to have:  Soreness and pain. This may last for a few days.  Light-headedness.  Mild bleeding from your vagina or dark-colored, grainy discharge from your vagina. This may last for a few days. You may need to wear a sanitary pad.  Spotting for at least 48 hours after the procedure. Follow these instructions at home:   Take over-the-counter and prescription medicines only as told by your doctor. Ask your doctor what medicines you can start taking again. This is very important if you take blood-thinning medicine.  Do not drive or use heavy machinery while taking prescription pain medicine.  For 3 days, or as long as your doctor tells you, avoid: ? Douching. ? Using tampons. ? Having sex.  If you use birth control (contraception), keep using it.  Limit activity for the first day after the procedure. Ask your doctor what activities are safe for you.  It is up to you to get the results of your procedure. Ask your doctor when your results will be ready.  Keep all follow-up visits as told by your doctor. This is important. Contact a doctor if:  You get a skin rash. Get help right away if:  You are bleeding a lot from your vagina. It is a lot of bleeding if you are using more than one pad an hour for 2 hours in a row.  You have clumps of blood (blood clots) coming from your vagina.  You have a fever.  You have chills  You have pain in your lower belly (pelvic area).  You have signs of infection, such as vaginal  discharge that is: ? Different than usual. ? Yellow. ? Bad-smelling.  You have very pain or cramps in your lower belly that do not get better with medicine.  You feel light-headed.  You feel dizzy.  You pass out (faint). Summary  If you did not have a tissue sample removed (did not have a biopsy), you may only have some spotting for a few days. You can go back to your normal activities.  If you had a tissue sample removed, it is common to have mild pain and spotting for 48 hours.  For 3 days, or as long as your doctor tells you, avoid douching, using tampons and having sex.  Get help right away if you have bleeding, very bad pain, or signs of infection. This information is not intended to replace advice given to you by your health care provider. Make sure you discuss any questions you have with your health care provider. Document Revised: 07/26/2017 Document Reviewed: 05/02/2016 Elsevier Patient Education  2020 Elsevier Inc.  

## 2020-04-01 NOTE — Addendum Note (Signed)
Addended by: Gerome Apley on: 04/01/2020 11:43 AM   Modules accepted: Orders

## 2020-04-04 LAB — SURGICAL PATHOLOGY

## 2020-04-07 ENCOUNTER — Telehealth: Payer: Self-pay

## 2020-04-07 NOTE — Telephone Encounter (Signed)
-----   Message from Adam Phenix, MD sent at 04/07/2020  2:59 PM EDT ----- Low grade biopsies, repeat pap in 12 months

## 2020-04-07 NOTE — Progress Notes (Signed)
Low grade biopsies, repeat pap in 12 months

## 2020-04-07 NOTE — Telephone Encounter (Signed)
Called pt to go over test results, no answer, left VM for call back.

## 2020-04-08 ENCOUNTER — Telehealth (INDEPENDENT_AMBULATORY_CARE_PROVIDER_SITE_OTHER): Payer: Medicaid Other

## 2020-04-08 DIAGNOSIS — D069 Carcinoma in situ of cervix, unspecified: Secondary | ICD-10-CM

## 2020-04-08 NOTE — Telephone Encounter (Signed)
Pt left voicemail on nurse line requesting results. Per chart review pt was called by Elita Quick, CMA yesterday, who left VM. Results from recent colposcopy given to pt and provider recommendation to follow up in 1 year with PAP smear. Pt verbalized understanding.

## 2020-04-08 NOTE — Telephone Encounter (Signed)
Addressed in encounter from 04/08/20.

## 2024-07-03 ENCOUNTER — Encounter (HOSPITAL_COMMUNITY): Payer: Self-pay

## 2024-07-03 ENCOUNTER — Ambulatory Visit (HOSPITAL_COMMUNITY)
Admission: EM | Admit: 2024-07-03 | Discharge: 2024-07-03 | Disposition: A | Discharge status: Home / Self Care | Attending: Emergency Medicine | Admitting: Emergency Medicine

## 2024-07-03 DIAGNOSIS — Z3202 Encounter for pregnancy test, result negative: Secondary | ICD-10-CM | POA: Diagnosis not present

## 2024-07-03 DIAGNOSIS — M545 Low back pain, unspecified: Secondary | ICD-10-CM | POA: Insufficient documentation

## 2024-07-03 DIAGNOSIS — R350 Frequency of micturition: Secondary | ICD-10-CM | POA: Diagnosis present

## 2024-07-03 LAB — POCT URINALYSIS DIP (MANUAL ENTRY)
Bilirubin, UA: NEGATIVE
Glucose, UA: NEGATIVE mg/dL
Ketones, POC UA: NEGATIVE mg/dL
Leukocytes, UA: NEGATIVE
Nitrite, UA: NEGATIVE
Protein Ur, POC: NEGATIVE mg/dL
Spec Grav, UA: 1.015 (ref 1.010–1.025)
Urobilinogen, UA: 0.2 U/dL
pH, UA: 7 (ref 5.0–8.0)

## 2024-07-03 LAB — POCT URINE PREGNANCY: Preg Test, Ur: NEGATIVE

## 2024-07-03 MED ORDER — TAMSULOSIN HCL 0.4 MG PO CAPS: 0.4000 mg | ORAL_CAPSULE | Freq: Every day | ORAL | 0 refills | Status: AC

## 2024-07-03 MED ORDER — KETOROLAC TROMETHAMINE 30 MG/ML IJ SOLN
30.0000 mg | Freq: Once | INTRAMUSCULAR | Status: AC
  Administered 2024-07-03: 30 mg via INTRAMUSCULAR

## 2024-07-03 MED ORDER — KETOROLAC TROMETHAMINE 30 MG/ML IJ SOLN
INTRAMUSCULAR | Status: AC
  Filled 2024-07-03: qty 1

## 2024-07-03 MED ORDER — KETOROLAC TROMETHAMINE 10 MG PO TABS: 10.0000 mg | ORAL_TABLET | Freq: Four times a day (QID) | ORAL | 0 refills | Status: AC | PRN

## 2024-07-03 NOTE — ED Provider Notes (Signed)
 [facility]    CSN: [id number] Arrival date & time: 07/03/24  1043      History   Chief Complaint Chief Complaint  Patient presents with   Back Pain    HPI Sarah Foster is a 38 y.o. female.   Patient presents with  right low back pain that radiates around to her right lower abdomen and groin for the past week.  Patient reports that at times the pain is very severe and then subsides some.  Patient states that she has also had urinary frequency for about 1 week as well.  Patient denies fever, body aches, chills, weakness, hematuria, and dysuria.  Patient also denies nausea, vomiting, and diarrhea.  Patient states that she has been taking pain ease with no relief.  LMP 11/2.  The history is provided by the patient and medical records.  Back Pain   Past Medical History:  Diagnosis Date   Abnormal maternal serum screening test 05/29/2016   1 in 171 DSR from [facility] screen; patient declined NIPS and amniocentesis   Anxiety    GERD (gastroesophageal reflux disease)    TUMS   Indication for care in labor or delivery 08/06/2018   Vaginal Pap smear, abnormal 2017   pt stated needs colposcopy after delivery    Patient Active Problem List   Diagnosis Date Noted   Atypical squamous cells cannot exclude high grade squamous intraepithelial lesion on cytologic smear of cervix (ASC-H) 04/01/2020   Postpartum care following vaginal delivery 08/08/2018   Acid reflux 08/10/2016   Cervical intraepithelial neoplasia grade III with severe dysplasia 08/10/2016    Past Surgical History:  Procedure Laterality Date   NO PAST SURGERIES      OB History     Gravida  4   Para  2   Term  2   Preterm  0   AB  2   Living  2      SAB  2   IAB  0   Ectopic  0   Multiple  0   Live Births  2            Home Medications    Prior to Admission medications   Medication Sig Start Date End Date Taking? Authorizing Provider  ketorolac  (TORADOL ) 10 MG tablet Take 1  tablet (10 mg total) by mouth every 6 (six) hours as needed for moderate pain (pain score 4-6) or severe pain (pain score 7-10). 07/03/24  Yes Johnie, Guiseppe Flanagan A, NP  tamsulosin (FLOMAX) 0.4 MG CAPS capsule Take 1 capsule (0.4 mg total) by mouth daily. 07/03/24  Yes Johnie, Francoise Chojnowski A, NP  fluticasone  (FLONASE ) 50 MCG/ACT nasal spray Place 1 spray into both nostrils daily. 10/13/17   Jason Fish B, PA-C  phenol (CHLORASEPTIC) 1.4 % LIQD Use as directed 1 spray in the mouth or throat as needed for throat irritation / pain. Patient not taking: Reported on 08/05/2018 10/13/17   Murray, Alyssa B, PA-C  Prenatal Vit-Fe Fumarate-FA (PRENATAL MULTIVITAMIN) TABS tablet Take 1 tablet by mouth daily at 12 noon.    [provider]    Family History History reviewed. No pertinent family history.  Social History Social History   Tobacco Use   Smoking status: Former    Current packs/day: 0.50    Types: Cigarettes   Smokeless tobacco: Former  Building Services Engineer status: Never Used  Substance Use Topics   Alcohol use: Yes    Comment: occas   Drug use:  Not Currently    Types: Marijuana    Comment: marijuana candy     Allergies   Patient has no known allergies.   Review of Systems Review of Systems  Musculoskeletal:  Positive for back pain.   Per HPI  Physical Exam Triage Vital Signs ED Triage Vitals  Encounter Vitals Group     BP 07/03/24 1224 126/82     Girls Systolic BP Percentile --      Girls Diastolic BP Percentile --      Boys Systolic BP Percentile --      Boys Diastolic BP Percentile --      Pulse Rate 07/03/24 1224 70     Resp 07/03/24 1224 16     Temp 07/03/24 1224 98 F (36.7 C)     Temp Source 07/03/24 1224 Oral     SpO2 07/03/24 1224 99 %     Weight --      Height --      Head Circumference --      Peak Flow --      Pain Score 07/03/24 1222 10     Pain Loc --      Pain Education --      Exclude from Growth Chart --    No data found.  Updated Vital  Signs BP 126/82 (BP Location: Right Arm)   Pulse 70   Temp 98 F (36.7 C) (Oral)   Resp 16   LMP 06/28/2024 (Exact Date)   SpO2 99%   Breastfeeding No   Visual Acuity Right Eye Distance:   Left Eye Distance:   Bilateral Distance:    Right Eye Near:   Left Eye Near:    Bilateral Near:     Physical Exam Vitals and nursing note reviewed.  Constitutional:      General: She is awake. She is not in acute distress.    Appearance: Normal appearance. She is well-developed and well-groomed. She is not ill-appearing.  Abdominal:     General: Abdomen is flat. Bowel sounds are normal.     Palpations: Abdomen is soft.     Tenderness: There is no abdominal tenderness. There is no right CVA tenderness, left CVA tenderness, guarding or rebound.  Musculoskeletal:     Cervical back: Normal.     Thoracic back: Normal.     Lumbar back: Tenderness present. No swelling, edema, deformity, signs of trauma or bony tenderness. Normal range of motion. Negative right straight leg raise test and negative left straight leg raise test.       Back:     Comments: Tenderness noted to right low back without spinous process tenderness.  Skin:    General: Skin is warm and dry.  Neurological:     Mental Status: She is alert.  Psychiatric:        Behavior: Behavior is cooperative.      UC Treatments / Results  Labs (all labs ordered are listed, but only abnormal results are displayed) Labs Reviewed  POCT URINALYSIS DIP (MANUAL ENTRY) - Abnormal; Notable for the following components:      Result Value   Clarity, UA cloudy (*)    Blood, UA trace-intact (*)    All other components within normal limits  URINE CULTURE  POCT URINE PREGNANCY    EKG   Radiology No results found.  Procedures Procedures (including critical care time)  Medications Ordered in UC Medications  ketorolac  (TORADOL ) 30 MG/ML injection 30 mg (30 mg Intramuscular Given 07/03/24 1311)  Initial Impression / Assessment  and Plan / UC Course  I have reviewed the triage vital signs and the nursing notes.  Pertinent labs & imaging results that were available during my care of the patient were reviewed by me and considered in my medical decision making (see chart for details).     Patient is overall well-appearing.  Vitals are stable.  Urinalysis reveals trace intact RBCs, will send urine culture to confirm presence of urinary tract infection.  UPT negative.  Suspicious for presence of kidney stone due to intermittent worsening low back pain that radiates to her lower abdomen and groin region with presence of urinary frequency.  Prescribed Flomax to assist with passing of kidney stone.  Given IM Toradol  in clinic for acute pain.  Prescribed additional Toradol  for additional pain relief.  Discussed follow-up, return, and strict ER precautions. Final Clinical Impressions(s) / UC Diagnoses   Final diagnoses:  Urinary frequency  Acute right-sided low back pain without sciatica     Discharge Instructions      As discussed your urinalysis did not reveal any signs of infection at this time, but does show a trace amount of blood.  We will send a urine culture to confirm if there is any presence of urinary tract infection.  Your pregnancy test was negative. I believe your symptoms could likely be related to you currently passing a kidney stone.   I am giving you an injection of Toradol  in clinic to help with pain related to this.  Do not take any additional Toradol  for at least 8 hours after receiving this injection.  After this you can take this every 6 hours as needed for pain.  Do not take this with other NSAIDs including ibuprofen , Advil , Motrin , naproxen, or Aleve.  You can take 500 to 1000 mg of Tylenol  every 6-8 hours as needed for breakthrough pain. Start taking tamsulosin (Flomax) once daily which will help to pass a kidney stone if there is one present. Follow-up with your primary care provider or return here  as needed. If you develop worsening pain, blood in your urine, fever, weakness, or inability to urinate please seek immediate medical treatment in the emergency department.   ED Prescriptions     Medication Sig Dispense Auth. Provider   ketorolac  (TORADOL ) 10 MG tablet Take 1 tablet (10 mg total) by mouth every 6 (six) hours as needed for moderate pain (pain score 4-6) or severe pain (pain score 7-10). 20 tablet Johnie Flaming A, NP   tamsulosin (FLOMAX) 0.4 MG CAPS capsule Take 1 capsule (0.4 mg total) by mouth daily. 30 capsule Johnie Flaming A, NP      PDMP not reviewed this encounter.   Johnie Flaming A, NP 07/03/24 1406

## 2024-07-03 NOTE — Discharge Instructions (Addendum)
 As discussed your urinalysis did not reveal any signs of infection at this time, but does show a trace amount of blood.  We will send a urine culture to confirm if there is any presence of urinary tract infection.  Your pregnancy test was negative. I believe your symptoms could likely be related to you currently passing a kidney stone.   I am giving you an injection of Toradol  in clinic to help with pain related to this.  Do not take any additional Toradol  for at least 8 hours after receiving this injection.  After this you can take this every 6 hours as needed for pain.  Do not take this with other NSAIDs including ibuprofen , Advil , Motrin , naproxen, or Aleve.  You can take 500 to 1000 mg of Tylenol  every 6-8 hours as needed for breakthrough pain. Start taking tamsulosin (Flomax) once daily which will help to pass a kidney stone if there is one present. Follow-up with your primary care provider or return here as needed. If you develop worsening pain, blood in your urine, fever, weakness, or inability to urinate please seek immediate medical treatment in the emergency department.

## 2024-07-03 NOTE — ED Triage Notes (Addendum)
 Pt states lower back pain that radiated around to her lower abdomen and groin for the past week. Also having urinary frequency.States she took pain ease with no relief.

## 2024-07-04 LAB — URINE CULTURE: Culture: <10000 — AB

## 2024-07-06 ENCOUNTER — Ambulatory Visit (HOSPITAL_COMMUNITY): Payer: Self-pay
